# Patient Record
Sex: Male | Born: 1970 | Race: White | Hispanic: No | Marital: Married | Smoking: Current some day smoker
Health system: Southern US, Community
[De-identification: ages and names within clinical notes are randomized; demographics above are authoritative.]

## PROBLEM LIST (undated history)

## (undated) DIAGNOSIS — C801 Malignant (primary) neoplasm, unspecified: Secondary | ICD-10-CM

## (undated) HISTORY — PX: TUMOR REMOVAL: SHX12

## (undated) HISTORY — PX: TONSILLECTOMY: SUR1361

---

## 2017-07-06 ENCOUNTER — Emergency Department (HOSPITAL_COMMUNITY): Payer: Self-pay

## 2017-07-06 ENCOUNTER — Emergency Department (HOSPITAL_COMMUNITY)
Admission: EM | Admit: 2017-07-06 | Discharge: 2017-07-06 | Disposition: A | Discharge status: Home / Self Care | Payer: Self-pay | Attending: Emergency Medicine | Admitting: Emergency Medicine

## 2017-07-06 ENCOUNTER — Encounter (HOSPITAL_COMMUNITY): Payer: Self-pay | Admitting: Emergency Medicine

## 2017-07-06 ENCOUNTER — Other Ambulatory Visit: Payer: Self-pay

## 2017-07-06 DIAGNOSIS — Y999 Unspecified external cause status: Secondary | ICD-10-CM | POA: Insufficient documentation

## 2017-07-06 DIAGNOSIS — M67432 Ganglion, left wrist: Secondary | ICD-10-CM | POA: Insufficient documentation

## 2017-07-06 DIAGNOSIS — W010XXA Fall on same level from slipping, tripping and stumbling without subsequent striking against object, initial encounter: Secondary | ICD-10-CM | POA: Insufficient documentation

## 2017-07-06 DIAGNOSIS — Y929 Unspecified place or not applicable: Secondary | ICD-10-CM | POA: Insufficient documentation

## 2017-07-06 DIAGNOSIS — Y9301 Activity, walking, marching and hiking: Secondary | ICD-10-CM | POA: Insufficient documentation

## 2017-07-06 DIAGNOSIS — S63502A Unspecified sprain of left wrist, initial encounter: Secondary | ICD-10-CM | POA: Insufficient documentation

## 2017-07-06 HISTORY — DX: Malignant (primary) neoplasm, unspecified: C80.1

## 2017-07-06 MED ORDER — HYDROCODONE-ACETAMINOPHEN 5-325 MG PO TABS: 1.0000 | ORAL_TABLET | ORAL | 0 refills | Status: DC | PRN

## 2017-07-06 MED ORDER — IBUPROFEN 800 MG PO TABS: 800.0000 mg | ORAL_TABLET | Freq: Three times a day (TID) | ORAL | 0 refills | Status: DC

## 2017-07-06 MED ORDER — IBUPROFEN 800 MG PO TABS
800.0000 mg | ORAL_TABLET | Freq: Once | ORAL | Status: AC
  Administered 2017-07-06: 800 mg via ORAL
  Filled 2017-07-06: qty 1

## 2017-07-06 NOTE — ED Notes (Signed)
Pt to xray

## 2017-07-06 NOTE — ED Notes (Signed)
Pt left wrist has a large knot (deformity). Stated this happened 3 hours ago. Slight numbness to the back of wrist. States index finger and middle finger are tingling.

## 2017-07-06 NOTE — Discharge Instructions (Addendum)
Rest the wrist by using the ace wrap, ice and elevation will help with pain, swelling and frequently may also help reduce if not completely resolve the size of the suspected ganglion cyst.  Apply ice for 15 minutes every 2-3 hours for the next 2 days.  You may also start using a heating pad which will help speed healing, but do not use heat until the swelling and tingling is resolved.  Call Dr. Caralyn Guile for further evaluation of your wrist if your symptoms are not improving over the next 7-10 days.

## 2017-07-06 NOTE — ED Provider Notes (Signed)
[facility] EMERGENCY DEPARTMENT Provider Note   CSN: [id number] Arrival date & time: 07/06/17  1607     History   Chief Complaint Chief Complaint  Patient presents with  . Wrist Injury    HPI Brent Tran is a 47 y.o. right handed male presenting with left wrist pain and swelling after tripping, catching himself with the outstretched left hand.  He heard a popping sensation with the injury and has had persistent pain and swelling since the event.  He denies numbness in his hand or fingers.  Movement and palpation worsens his pain. He has had no medicine or treatment prior to arrival.  The injury occurred as he was stepping onto his roof from a ladder just prior to arrival.  The history is provided by the patient and the spouse.    Past Medical History:  Diagnosis Date  . Cancer ([place])     There are no active problems to display for this patient.   Past Surgical History:  Procedure Laterality Date  . TUMOR REMOVAL          Home Medications    Prior to Admission medications   Medication Sig Start Date End Date Taking? Authorizing Provider  HYDROcodone-acetaminophen (NORCO/VICODIN) 5-325 MG tablet Take 1 tablet by mouth every 4 (four) hours as needed. 07/06/17   Anjana Cheek, Almyra Free, PA-C  ibuprofen (ADVIL,MOTRIN) 800 MG tablet Take 1 tablet (800 mg total) by mouth 3 (three) times daily. 07/06/17   Evalee Jefferson, PA-C    Family History History reviewed. No pertinent family history.  Social History Social History   Tobacco Use  . Smoking status: Never Smoker  . Smokeless tobacco: Never Used  Substance Use Topics  . Alcohol use: Never    Frequency: Never  . Drug use: Never     Allergies   Penicillins and Ultram [tramadol hcl]   Review of Systems Review of Systems  Constitutional: Negative for fever.  Musculoskeletal: Positive for arthralgias and joint swelling. Negative for myalgias.  Neurological: Negative for weakness and numbness.     Physical  Exam Updated Vital Signs BP 117/75 (BP Location: Right Arm)   Pulse 85   Temp 99 F (37.2 C) (Temporal)   Resp 10   Ht 5\' 10"  (1.778 m)   Wt 81.6 kg (180 lb)   SpO2 97%   BMI 25.83 kg/m   Physical Exam  Constitutional: He appears well-developed and well-nourished.  HENT:  Head: Atraumatic.  Neck: Normal range of motion.  Cardiovascular:  Pulses:      Radial pulses are 2+ on the right side, and 2+ on the left side.  Pulses equal bilaterally  Musculoskeletal: He exhibits tenderness.       Left wrist: He exhibits decreased range of motion, tenderness, bony tenderness and swelling. He exhibits no crepitus and no deformity.  Localized, well circumscribed nodule left dorsal wrist, slightly tender, mobile.  Neurological: He is alert. He has normal strength. He displays normal reflexes. No sensory deficit.  Skin: Skin is warm and dry.  Psychiatric: He has a normal mood and affect.     ED Treatments / Results  Labs (all labs ordered are listed, but only abnormal results are displayed) Labs Reviewed - No data to display  EKG None  Radiology Dg Wrist Complete Left  Result Date: 07/06/2017 CLINICAL DATA:  Fall, LEFT wrist injury. Hematoma present on dorsal aspect. EXAM: LEFT WRIST - COMPLETE 3+ VIEW COMPARISON:  None. FINDINGS: Osseous alignment is normal. No fracture line or displaced  fracture fragment identified. Soft tissue prominence along the dorsum of the carpal bones, compatible with the given history of hematoma. IMPRESSION: No osseous fracture or dislocation. Electronically Signed   By: Franki Cabot M.D.   On: 07/06/2017 16:41    Procedures Procedures (including critical care time)  Medications Ordered in ED Medications  ibuprofen (ADVIL,MOTRIN) tablet 800 mg (800 mg Oral Given 07/06/17 1712)     Initial Impression / Assessment and Plan / ED Course  I have reviewed the triage vital signs and the nursing notes.  Pertinent labs & imaging results that were available  during my care of the patient were reviewed by me and considered in my medical decision making (see chart for details).     Pt with wrist sprain with exam suggesting traumatic ganglion cyst.  He was placed in an ace wrap, discussed RICE, joint rest.  Plan f/u with ortho if sx persist or do not improve as expected. Referral given.   Final Clinical Impressions(s) / ED Diagnoses   Final diagnoses:  Wrist sprain, left, initial encounter  Ganglion cyst of dorsum of left wrist    ED Discharge Orders        Ordered    ibuprofen (ADVIL,MOTRIN) 800 MG tablet  3 times daily     07/06/17 1712    HYDROcodone-acetaminophen (NORCO/VICODIN) 5-325 MG tablet  Every 4 hours PRN     07/06/17 1712       Evalee Jefferson, PA-C 07/07/17 0138    Daleen Bo, MD 07/07/17 [phone]

## 2017-07-06 NOTE — ED Triage Notes (Signed)
Pt fell and caught himself on his left wrist. Heard a "pop" swelling noted to left wrist.

## 2018-04-03 ENCOUNTER — Emergency Department (HOSPITAL_COMMUNITY)
Admission: EM | Admit: 2018-04-03 | Discharge: 2018-04-04 | Disposition: A | Discharge status: Home / Self Care | Payer: Self-pay | Attending: Emergency Medicine | Admitting: Emergency Medicine

## 2018-04-03 ENCOUNTER — Other Ambulatory Visit: Payer: Self-pay

## 2018-04-03 ENCOUNTER — Encounter (HOSPITAL_COMMUNITY): Payer: Self-pay | Admitting: Emergency Medicine

## 2018-04-03 DIAGNOSIS — F19150 Other psychoactive substance abuse with psychoactive substance-induced psychotic disorder with delusions: Secondary | ICD-10-CM | POA: Insufficient documentation

## 2018-04-03 DIAGNOSIS — F1910 Other psychoactive substance abuse, uncomplicated: Secondary | ICD-10-CM

## 2018-04-03 DIAGNOSIS — F22 Delusional disorders: Secondary | ICD-10-CM

## 2018-04-03 DIAGNOSIS — F17210 Nicotine dependence, cigarettes, uncomplicated: Secondary | ICD-10-CM | POA: Insufficient documentation

## 2018-04-03 DIAGNOSIS — R45851 Suicidal ideations: Secondary | ICD-10-CM | POA: Insufficient documentation

## 2018-04-03 LAB — RAPID URINE DRUG SCREEN, HOSP PERFORMED
Amphetamines: POSITIVE — AB
Barbiturates: NOT DETECTED
Benzodiazepines: NOT DETECTED
COCAINE: NOT DETECTED
OPIATES: NOT DETECTED
Tetrahydrocannabinol: NOT DETECTED

## 2018-04-03 LAB — COMPREHENSIVE METABOLIC PANEL
ALK PHOS: 55 U/L (ref 38–126)
ALT: 21 U/L (ref 0–44)
AST: 24 U/L (ref 15–41)
Albumin: 4.2 g/dL (ref 3.5–5.0)
Anion gap: 8 (ref 5–15)
BUN: 13 mg/dL (ref 6–20)
CO2: 26 mmol/L (ref 22–32)
CREATININE: 0.91 mg/dL (ref 0.61–1.24)
Calcium: 9.1 mg/dL (ref 8.9–10.3)
Chloride: 103 mmol/L (ref 98–111)
GFR calc non Af Amer: >60 mL/min (ref >60)
GLUCOSE: 98 mg/dL (ref 70–99)
Potassium: 3.4 mmol/L — ABNORMAL LOW (ref 3.5–5.1)
SODIUM: 137 mmol/L (ref 135–145)
Total Bilirubin: 0.6 mg/dL (ref 0.3–1.2)
Total Protein: 6.7 g/dL (ref 6.5–8.1)

## 2018-04-03 LAB — CBC WITH DIFFERENTIAL/PLATELET
ABS IMMATURE GRANULOCYTES: 0.02 10*3/uL (ref 0.00–0.07)
BASOS ABS: 0.1 10*3/uL (ref 0.0–0.1)
BASOS PCT: 1 %
EOS ABS: 0.5 10*3/uL (ref 0.0–0.5)
Eosinophils Relative: 5 %
HEMATOCRIT: 44.4 % (ref 39.0–52.0)
Hemoglobin: 14.7 g/dL (ref 13.0–17.0)
IMMATURE GRANULOCYTES: 0 %
LYMPHS ABS: 2.4 10*3/uL (ref 0.7–4.0)
Lymphocytes Relative: 25 %
MCH: 30.2 pg (ref 26.0–34.0)
MCHC: 33.1 g/dL (ref 30.0–36.0)
MCV: 91.2 fL (ref 80.0–100.0)
MONOS PCT: 6 %
Monocytes Absolute: 0.6 10*3/uL (ref 0.1–1.0)
NEUTROS ABS: 6.1 10*3/uL (ref 1.7–7.7)
NEUTROS PCT: 63 %
NRBC: 0 % (ref 0.0–0.2)
PLATELETS: 387 10*3/uL (ref 150–400)
RBC: 4.87 MIL/uL (ref 4.22–5.81)
RDW: 12.5 % (ref 11.5–15.5)
WBC: 9.6 10*3/uL (ref 4.0–10.5)

## 2018-04-03 LAB — ETHANOL: Alcohol, Ethyl (B): <10 mg/dL (ref <10)

## 2018-04-03 MED ORDER — POTASSIUM CHLORIDE CRYS ER 20 MEQ PO TBCR
40.0000 meq | EXTENDED_RELEASE_TABLET | Freq: Once | ORAL | Status: AC
  Administered 2018-04-03: 40 meq via ORAL
  Filled 2018-04-03: qty 2

## 2018-04-03 NOTE — Progress Notes (Signed)
CSW received phone call from pt's wife, Thaddaeus Granja [phone]). She reports that she petitioned for pt's IVC earlier today after he had attempted to jump out of the car as she was taking him to an outpatient [place] department. Mrs. Rison reports that pt is using "ICE" and has been on a "downward spiral" for the last three weeks. He has become increasingly paranoid and delusional; to the point that she reports she has been locking her bedroom door at night out of fear of his erratic behavior (accusing her of cheating, drilling holes in her bedroom wall to watch to see if she's with another man, feeling that people are looking in his windows). She is unsure about the frequency and route of his substance use. CSW will notified counselor and [facility] NP on collateral information provided.   Audree Camel, LCSW, [place] Disposition [facility]/TTS [phone]

## 2018-04-03 NOTE — ED Notes (Signed)
Sitter to desk stating pt requests tablet from his personal belongings, this RN advised pt not allowed to have personal belongings, pt advised, pt calm  

## 2018-04-03 NOTE — ED Notes (Signed)
Telepsych set up in room
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
## 2018-04-03 NOTE — ED Triage Notes (Signed)
Papers taken out by patient's wife for commitment due to drug use. Patient calm and cooperative in Triage.

## 2018-04-03 NOTE — ED Provider Notes (Signed)
[facility] EMERGENCY DEPARTMENT Provider Note   CSN: [id number] Arrival date & time: 04/03/18  1516    History   Chief Complaint Chief Complaint  Patient presents with   V70.1    HPI Brent Tran is a 48 y.o. male.     HPI  Pt was seen at 1540. Per Police, IVC paperwork and pt report: Pt brought to ED by Police under IVC taken out by pt's wife for "been on meth or ice and he has been seeing things and hearing voices." "He has been talking about killing himself. He has been peeking into windows and around doors. He was seen by someone from [facility] on last night and referred to mental health today but refuses to for a followup. He attempted to  Jump from the car as his wife was driving him there." Pt himself denies SI, but states he "knows his wife has been talking to someone else on the phone because she's acting secretive." Denies HI.   Past Medical History:  Diagnosis Date   Cancer ([place])     There are no active problems to display for this patient.   Past Surgical History:  Procedure Laterality Date   TONSILLECTOMY     TUMOR REMOVAL          Home Medications    Prior to Admission medications   Medication Sig Start Date End Date Taking? Authorizing Provider  HYDROcodone-acetaminophen (NORCO/VICODIN) 5-325 MG tablet Take 1 tablet by mouth every 4 (four) hours as needed. 07/06/17   Idol, Almyra Free, PA-C  ibuprofen (ADVIL,MOTRIN) 800 MG tablet Take 1 tablet (800 mg total) by mouth 3 (three) times daily. 07/06/17   Evalee Jefferson, PA-C    Family History Family History  Problem Relation Age of Onset   Heart disease Father    Cancer Father     Social History Social History   Tobacco Use   Smoking status: Current Every Day Smoker    Types: Cigarettes   Smokeless tobacco: Never Used  Substance Use Topics   Alcohol use: Never    Frequency: Never   Drug use: Yes    Comment: ice, 2 times daily     Allergies   Penicillins and Ultram [tramadol  hcl]   Review of Systems Review of Systems ROS: Statement: All systems negative except as marked or noted in the HPI; Constitutional: Negative for fever and chills. ; ; Eyes: Negative for eye pain, redness and discharge. ; ; ENMT: Negative for ear pain, hoarseness, nasal congestion, sinus pressure and sore throat. ; ; Cardiovascular: Negative for chest pain, palpitations, diaphoresis, dyspnea and peripheral edema. ; ; Respiratory: Negative for cough, wheezing and stridor. ; ; Gastrointestinal: Negative for nausea, vomiting, diarrhea, abdominal pain, blood in stool, hematemesis, jaundice and rectal bleeding. . ; ; Genitourinary: Negative for dysuria, flank pain and hematuria. ; ; Musculoskeletal: Negative for back pain and neck pain. Negative for swelling and trauma.; ; Skin: Negative for pruritus, rash, abrasions, blisters, bruising and skin lesion.; ; Neuro: Negative for headache, lightheadedness and neck stiffness. Negative for weakness, altered level of consciousness, altered mental status, extremity weakness, paresthesias, involuntary movement, seizure and syncope.; Psych:  +SI, hallucinations per IVC paperwork. No HI.       Physical Exam Updated Vital Signs BP (!) 132/92 (BP Location: Right Arm)    Pulse 82    Temp 98.2 F (36.8 C) (Oral)    Resp 15    Ht 5\' 9"  (1.753 m)    SpO2 100%  BMI 26.58 kg/m   Physical Exam 1545: Physical examination:  Nursing notes reviewed; Vital signs and O2 SAT reviewed;  Constitutional: Well developed, Well nourished, Well hydrated, In no acute distress; Head:  Normocephalic, atraumatic; Eyes: EOMI, PERRL, No scleral icterus; ENMT: Mouth and pharynx normal, Mucous membranes moist; Neck: Supple, Full range of motion; Cardiovascular: Regular rate and rhythm; Respiratory: Breath sounds clear, No wheezes.  Speaking full sentences with ease, Normal respiratory effort/excursion; Chest: No deformity, Movement normal; Abdomen: Nondistended; Extremities: No deformity.;  Neuro: AA&Ox3, Major CN grossly intact.  Speech clear. No gross focal motor deficits in extremities. Climbs on and off stretcher easily by himself. Gait steady.; Skin: Color normal, Warm, Dry.; Psych:  Affect flat.    ED Treatments / Results  Labs (all labs ordered are listed, but only abnormal results are displayed)   EKG EKG Interpretation  Date/Time:  Monday April 03 2018 16:00:02 EDT Ventricular Rate:  77 PR Interval:    QRS Duration: 77 QT Interval:  382 QTC Calculation: 433 R Axis:   54 Text Interpretation:  Sinus rhythm Right atrial enlargement Anterior infarct, old No old tracing to compare Confirmed by Francine Graven [phone]) on 04/03/2018 4:03:51 PM   Radiology   Procedures Procedures (including critical care time)  Medications Ordered in ED Medications - No data to display   Initial Impression / Assessment and Plan / ED Course  I have reviewed the triage vital signs and the nursing notes.  Pertinent labs & imaging results that were available during my care of the patient were reviewed by me and considered in my medical decision making (see chart for details).     MDM Reviewed: previous chart, nursing note and vitals Reviewed previous: labs Interpretation: labs and ECG    Results for orders placed or performed during the hospital encounter of 04/03/18  Comprehensive metabolic panel  Result Value Ref Range   Sodium 137 135 - 145 mmol/L   Potassium 3.4 (L) 3.5 - 5.1 mmol/L   Chloride 103 98 - 111 mmol/L   CO2 26 22 - 32 mmol/L   Glucose, Bld 98 70 - 99 mg/dL   BUN 13 6 - 20 mg/dL   Creatinine, Ser 0.91 0.61 - 1.24 mg/dL   Calcium 9.1 8.9 - 10.3 mg/dL   Total Protein 6.7 6.5 - 8.1 g/dL   Albumin 4.2 3.5 - 5.0 g/dL   AST 24 15 - 41 U/L   ALT 21 0 - 44 U/L   Alkaline Phosphatase 55 38 - 126 U/L   Total Bilirubin 0.6 0.3 - 1.2 mg/dL   GFR calc non Af Amer >60 >60 mL/min   GFR calc Af Amer >60 >60 mL/min   Anion gap 8 5 - 15  Ethanol  Result Value  Ref Range   Alcohol, Ethyl (B) <10 <10 mg/dL  Urine rapid drug screen (hosp performed)  Result Value Ref Range   Opiates NONE DETECTED NONE DETECTED   Cocaine NONE DETECTED NONE DETECTED   Benzodiazepines NONE DETECTED NONE DETECTED   Amphetamines POSITIVE (A) NONE DETECTED   Tetrahydrocannabinol NONE DETECTED NONE DETECTED   Barbiturates NONE DETECTED NONE DETECTED  CBC with Diff  Result Value Ref Range   WBC 9.6 4.0 - 10.5 K/uL   RBC 4.87 4.22 - 5.81 MIL/uL   Hemoglobin 14.7 13.0 - 17.0 g/dL   HCT 44.4 39.0 - 52.0 %   MCV 91.2 80.0 - 100.0 fL   MCH 30.2 26.0 - 34.0 pg   MCHC 33.1 30.0 -  36.0 g/dL   RDW 12.5 11.5 - 15.5 %   Platelets 387 150 - 400 K/uL   nRBC 0.0 0.0 - 0.2 %   Neutrophils Relative % 63 %   Neutro Abs 6.1 1.7 - 7.7 K/uL   Lymphocytes Relative 25 %   Lymphs Abs 2.4 0.7 - 4.0 K/uL   Monocytes Relative 6 %   Monocytes Absolute 0.6 0.1 - 1.0 K/uL   Eosinophils Relative 5 %   Eosinophils Absolute 0.5 0.0 - 0.5 K/uL   Basophils Relative 1 %   Basophils Absolute 0.1 0.0 - 0.1 K/uL   Immature Granulocytes 0 %   Abs Immature Granulocytes 0.02 0.00 - 0.07 K/uL    1630:  Potassium repleted PO. Will have TTS evaluate.   1835:  TTS has evaluated pt: pt to stay in ED overnight for re-evaluation tomorrow morning. Holding orders written.     Final Clinical Impressions(s) / ED Diagnoses   Final diagnoses:  None    ED Discharge Orders    None       Francine Graven, DO 04/03/18 2055

## 2018-04-04 ENCOUNTER — Encounter (HOSPITAL_COMMUNITY): Payer: Self-pay | Admitting: Registered Nurse

## 2018-04-04 NOTE — ED Notes (Signed)
Patient given discharge instruction, verbalized understand. Patient ambulatory out of the department.  

## 2018-04-04 NOTE — ED Notes (Signed)
Belonging returned to patient and wife

## 2018-04-04 NOTE — ED Notes (Signed)
Rescinded IVC Papers completed, and faxed to [place].

## 2018-04-04 NOTE — Consult Note (Addendum)
Tele Assessment   Brent Tran, 48 y.o., male patient presented to [facility] under IVC by his wife with complaints of drug use (ICE), paranoia and delusional thinking.  Patient seen via telepsych by this provider; chart reviewed and consulted with Dr. Dwyane Dee on 04/04/18.  On evaluation Brent Tran reports he was brought to the hospital because his wife had him committed so that he can get help.  Patient states that his wife said he was people on her; but patient reports " I think she went into relationship but she is afraid that I am going to do something to her something.  I didn't drill no holes in the wall.  I have my camera my phone and I was recording her because I thought she was in bed room talking to somebody on the phone.  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  Patient denies any auditory/visual hallucination or delusional thinking.  Patient also denies suicidal/self-harm/homicidal ideation, psychosis, and paranoia.  Patient denies psychiatric past history.  Reports he is never been inpatient or outpatient and has never been on any psych psychotropics.  Patient states he has been to rehab once about 10 years ago and need a 78-month program and was clean for 8 months when he finished; but he is not interested in any rehab at this time.  Patient does report that he has anxiety and feels that he does the methamphetamine to assist with his anxiety and is interested in being started on a medication to help with his anxiety.  Patient states that he is willing to go to [facility] for outpatient services. "That's where I was supposed to be going yesterday but she took me to the [facility] and have me committed."  Patient also denies a prior history of violence and states that he has no current charges nor is he on probation.  Patient states there are no weapons in his home.  Patient states he lives in the home with  his wife and no one else; but he does have 2 children that are grown.  During evaluation Corbett Moulder is sitting up in chair; he is alert/oriented x 4; calm/cooperative; and mood congruent with affect.  Patient is speaking in a clear tone at moderate volume, and normal pace; with good eye contact.  His thought process is coherent and relevant; There is no indication that he is currently responding to internal/external stimuli or experiencing delusional thought content.Possible that paranoia occurs immediately after drug use and then clears up.  Patient denies suicidal/self-harm/homicidal ideation, psychosis, and paranoia.  Patient has remained calm throughout assessment and has answered questions appropriately.   Spoke with the nurse of patient Aaron Edelman. RN who informed he has not seen any abnormal behavior of patient,  Patient has been calm and no problems; and patient hasn't appeared or acted paranoid or hallucinating.     Recommendations:  Outpatient psychiatric service.  Patient to follow up at [facility].    Disposition: Patient psychiatrically cleared No evidence of imminent risk to self or others at present.   Patient does not meet criteria for psychiatric inpatient admission. Supportive therapy provided about ongoing stressors. Discussed crisis plan, support from social network, calling 911, coming to the Emergency Department, and calling Suicide Hotline. Follow-up at [facility] for outpatient services   Spoke with Aaron Edelman, RN; informed of above recommendation and disposition; informed he  would inform EDP   Earleen Newport, NP

## 2018-04-04 NOTE — Clinical Social Work Note (Signed)
Met with patient, who denied that substance use has gotten away from him.  "My wife was calling me delusional and accusing me of things that were not true, and when I told her I was leaving her and going to [place], she called someone and I ended up here."  Later admitted that he has missed some days of work because he was using. 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 Patient was OK with me calling his wife, who confirmed that she would come pick him up.  She had no further questions for me, but did ask to speak to patient. I offered the patient a referral to services as [facility], but he declined.  He did, however, agree to follow up at [facility] "because I self medicate because of anxiety.  I got him an appointment for tomorrow. 

## 2018-04-04 NOTE — ED Notes (Signed)
Social worker at the bedside, waiting on wife to bring belonging and for paper work to be resented.

## 2018-04-04 NOTE — ED Notes (Signed)
Wife her to pick up, confirmed he has an appointment at [facility] tomorrow at [facility]

## 2018-04-04 NOTE — ED Provider Notes (Signed)
Pt brought in yesterday for psych eval.  He has been using meth and has been paranoid.  The pt denies si or hi.  He denies any hallucinations.  He was seen again this morning by Earleen Newport, NP for TTS and she with Dr. Dwyane Dee cleared him for d/c.  He was not interested in rehab.  I agree that he does not meet inpatient criteria.  Pt is stable for d/c home.  He is given a Warden/ranger for detox programs if he changes his mind.   Isla Pence, MD 04/04/18 1050

## 2019-03-22 ENCOUNTER — Emergency Department (HOSPITAL_COMMUNITY)
Admission: EM | Admit: 2019-03-22 | Discharge: 2019-03-22 | Disposition: A | Discharge status: Home / Self Care | Payer: Self-pay | Attending: Emergency Medicine | Admitting: Emergency Medicine

## 2019-03-22 ENCOUNTER — Emergency Department (HOSPITAL_COMMUNITY)
Admission: EM | Admit: 2019-03-22 | Discharge: 2019-03-22 | Disposition: A | Discharge status: Home / Self Care | Payer: Self-pay

## 2019-03-22 ENCOUNTER — Other Ambulatory Visit: Payer: Self-pay

## 2019-03-22 ENCOUNTER — Encounter (HOSPITAL_COMMUNITY): Payer: Self-pay | Admitting: Student

## 2019-03-22 ENCOUNTER — Encounter (HOSPITAL_COMMUNITY): Payer: Self-pay | Admitting: Emergency Medicine

## 2019-03-22 DIAGNOSIS — F17210 Nicotine dependence, cigarettes, uncomplicated: Secondary | ICD-10-CM | POA: Insufficient documentation

## 2019-03-22 DIAGNOSIS — R441 Visual hallucinations: Secondary | ICD-10-CM | POA: Insufficient documentation

## 2019-03-22 DIAGNOSIS — R440 Auditory hallucinations: Secondary | ICD-10-CM

## 2019-03-22 LAB — COMPREHENSIVE METABOLIC PANEL
ALT: 18 U/L (ref 0–44)
AST: 21 U/L (ref 15–41)
Albumin: 4.2 g/dL (ref 3.5–5.0)
Alkaline Phosphatase: 51 U/L (ref 38–126)
Anion gap: 7 (ref 5–15)
BUN: 10 mg/dL (ref 6–20)
CO2: 29 mmol/L (ref 22–32)
Calcium: 9.4 mg/dL (ref 8.9–10.3)
Chloride: 101 mmol/L (ref 98–111)
Creatinine, Ser: 0.77 mg/dL (ref 0.61–1.24)
GFR calc Af Amer: >60 mL/min (ref >60)
GFR calc non Af Amer: >60 mL/min (ref >60)
Glucose, Bld: 89 mg/dL (ref 70–99)
Potassium: 3.9 mmol/L (ref 3.5–5.1)
Sodium: 137 mmol/L (ref 135–145)
Total Bilirubin: 0.5 mg/dL (ref 0.3–1.2)
Total Protein: 7 g/dL (ref 6.5–8.1)

## 2019-03-22 LAB — CBC
HCT: 45.5 % (ref 39.0–52.0)
Hemoglobin: 15 g/dL (ref 13.0–17.0)
MCH: 29.8 pg (ref 26.0–34.0)
MCHC: 33 g/dL (ref 30.0–36.0)
MCV: 90.5 fL (ref 80.0–100.0)
Platelets: 382 10*3/uL (ref 150–400)
RBC: 5.03 MIL/uL (ref 4.22–5.81)
RDW: 12.5 % (ref 11.5–15.5)
WBC: 9.6 10*3/uL (ref 4.0–10.5)
nRBC: 0 % (ref 0.0–0.2)

## 2019-03-22 LAB — ACETAMINOPHEN LEVEL: Acetaminophen (Tylenol), Serum: <10 ug/mL — ABNORMAL LOW (ref 10–30)

## 2019-03-22 LAB — SALICYLATE LEVEL: Salicylate Lvl: <7 mg/dL — ABNORMAL LOW (ref 7.0–30.0)

## 2019-03-22 LAB — ETHANOL: Alcohol, Ethyl (B): <10 mg/dL (ref <10)

## 2019-03-22 NOTE — Discharge Instructions (Signed)
Please follow-up with outpatient psychiatric resources within 48 hours.  Return to the emergency department for new or worsening symptoms or any other concerns.

## 2019-03-22 NOTE — ED Triage Notes (Signed)
Pt returns after just eloping. Said he might as well stay. Pt has auditory hallucinations x3 days after meth. Denies si hi or hearing them currently.

## 2019-03-22 NOTE — ED Notes (Signed)
Pt left refuses d/c papers. Saying he wouldn't follow up anyway.

## 2019-03-22 NOTE — ED Triage Notes (Signed)
Patient complains of auditory hallucinations that began 3 days ago when he last used Methamphetamines. Patient denies SI/HI. Patient is here for medical clearance with hopes of utilizing outpatient services at [facility].

## 2019-03-22 NOTE — ED Notes (Signed)
Pt left stating he didn't want to stay. Denies SI HI said that he will wait for a ride.

## 2019-03-22 NOTE — ED Notes (Signed)
Pt wanting to leave again. Pt sitting on the bed calling for ride said that he can wait until the ride gets here

## 2019-03-22 NOTE — ED Provider Notes (Signed)
[facility] EMERGENCY DEPARTMENT Provider Note   CSN: [id number] Arrival date & time: 03/22/19  2007     History Chief Complaint  Patient presents with  . Hallucinations    Brent Tran is a 49 y.o. male with a history of tobacco abuse and prior bladder cancer status post tumor resection who presents to the emergency department with complaints of auditory hallucinations which have been occurring over the past 6 months, seem to worsen over the past few weeks.  Patient states that he is hearing voices, he used to hear his wife's voice, now no specific individual.  He does not further elaborate on what the voices are telling him.  These are auditory hallucinations occur intermittently, no specific alleviating or aggravating factors, not currently present.  Denies suicidal ideations or homicidal ideations.  He states that he has been using meth daily for the past few years, he stopped using a few days ago, he would like to quit.  He denies drug use use.  He does drink occasionally, not daily.  Denies fever, chills, cough, chest pain, shortness of breath, abdominal pain, or vomiting.  HPI     Past Medical History:  Diagnosis Date  . Cancer [facility])    bladder    There are no problems to display for this patient.   Past Surgical History:  Procedure Laterality Date  . TONSILLECTOMY    . TUMOR REMOVAL         Family History  Problem Relation Age of Onset  . Heart disease Father   . Cancer Father     Social History   Tobacco Use  . Smoking status: Current Every Day Smoker    Types: Cigarettes  . Smokeless tobacco: Never Used  Substance Use Topics  . Alcohol use: Never  . Drug use: Yes    Comment: ice, 2 times daily    Home Medications Prior to Admission medications   Medication Sig Start Date End Date Taking? Authorizing Provider  naproxen sodium (ALEVE) 220 MG tablet Take 220 mg by mouth 2 (two) times daily as needed (pain).    [provider]    Allergies     Ultram [tramadol hcl] and Penicillins  Review of Systems   Review of Systems  Constitutional: Negative for chills and fever.  Respiratory: Negative for cough and shortness of breath.   Cardiovascular: Negative for chest pain.  Gastrointestinal: Negative for abdominal pain and vomiting.  Neurological: Negative for headaches.  Psychiatric/Behavioral: Positive for hallucinations. Negative for suicidal ideas.  All other systems reviewed and are negative.   Physical Exam Updated Vital Signs BP 123/66 (BP Location: Left Arm)   Pulse 78   Temp 98.2 F (36.8 C)   Resp 18   SpO2 100%   Physical Exam Vitals and nursing note reviewed.  Constitutional:      General: He is not in acute distress.    Appearance: He is well-developed. He is not toxic-appearing.  HENT:     Head: Normocephalic and atraumatic.  Eyes:     General:        Right eye: No discharge.        Left eye: No discharge.     Conjunctiva/sclera: Conjunctivae normal.  Cardiovascular:     Rate and Rhythm: Normal rate and regular rhythm.  Pulmonary:     Effort: Pulmonary effort is normal. No respiratory distress.     Breath sounds: Normal breath sounds. No wheezing, rhonchi or rales.  Abdominal:     General:  There is no distension.     Palpations: Abdomen is soft.     Tenderness: There is no abdominal tenderness.  Musculoskeletal:     Cervical back: Neck supple.  Skin:    General: Skin is warm and dry.     Findings: No rash.  Neurological:     Mental Status: He is alert.     Comments: Clear speech.   Psychiatric:        Attention and Perception: He perceives auditory hallucinations. He does not perceive visual hallucinations.        Behavior: Behavior normal.        Thought Content: Thought content does not include homicidal or suicidal ideation. Thought content does not include homicidal or suicidal plan.     Comments: Patient does not appear to be actively responding to internal stimuli.     ED Results /  Procedures / Treatments   Labs (all labs ordered are listed, but only abnormal results are displayed) Labs Reviewed  COMPREHENSIVE METABOLIC PANEL  ETHANOL  SALICYLATE LEVEL  ACETAMINOPHEN LEVEL  CBC  RAPID URINE DRUG SCREEN, HOSP PERFORMED    EKG None  Radiology No results found.  Procedures Procedures (including critical care time)  Medications Ordered in ED Medications - No data to display  ED Course  I have reviewed the triage vital signs and the nursing notes.  Pertinent labs & imaging results that were available during my care of the patient were reviewed by me and considered in my medical decision making (see chart for details).    MDM Rules/Calculators/A&P                      Patient presents to the emergency department with complaints of auditory hallucinations and wanting to discontinue methamphetamine use.  Patient nontoxic, resting comfortably, vitals WNL.  Benign physical exam.  Denies current hallucinations and does not appear to be actively responding to internal stimuli.  Patient denies suicidal or homicidal ideations.  Will obtain screening labs and consult TTS.  CBC/CMP- unremarkable.  Remaining labs pending.   22:06: Patient informed staff he would like to leave the emergency department, he is alert & oriented x 3, he has denied suicidal/homicidal ideations on multiple assessment in the ED by staff, does not appear to be actively responding to internal stimuli, does not require IVC. Will provide resources. I discussed results, treatment plan, need for follow-up, and return precautions with the patient. Provided opportunity for questions, patient confirmed understanding and is in agreement with plan.   Findings and plan of care discussed with supervising physician Dr. Langston Masker who is in agreement.   Final Clinical Impression(s) / ED Diagnoses Final diagnoses:  Auditory hallucinations    Rx / DC Orders ED Discharge Orders    None       Leafy Kindle 03/22/19 2211    Wyvonnia Dusky, MD 03/23/19 [phone]

## 2019-12-01 ENCOUNTER — Emergency Department (HOSPITAL_COMMUNITY): Payer: Self-pay

## 2019-12-01 ENCOUNTER — Other Ambulatory Visit: Payer: Self-pay

## 2019-12-01 ENCOUNTER — Emergency Department (HOSPITAL_COMMUNITY)
Admission: EM | Admit: 2019-12-01 | Discharge: 2019-12-01 | Disposition: A | Discharge status: Home / Self Care | Payer: Self-pay | Attending: Emergency Medicine | Admitting: Emergency Medicine

## 2019-12-01 ENCOUNTER — Encounter (HOSPITAL_COMMUNITY): Payer: Self-pay | Admitting: *Deleted

## 2019-12-01 DIAGNOSIS — J0100 Acute maxillary sinusitis, unspecified: Secondary | ICD-10-CM

## 2019-12-01 DIAGNOSIS — S20212A Contusion of left front wall of thorax, initial encounter: Secondary | ICD-10-CM

## 2019-12-01 DIAGNOSIS — R079 Chest pain, unspecified: Secondary | ICD-10-CM | POA: Insufficient documentation

## 2019-12-01 DIAGNOSIS — R059 Cough, unspecified: Secondary | ICD-10-CM | POA: Insufficient documentation

## 2019-12-01 DIAGNOSIS — Z8551 Personal history of malignant neoplasm of bladder: Secondary | ICD-10-CM | POA: Insufficient documentation

## 2019-12-01 DIAGNOSIS — Y93H2 Activity, gardening and landscaping: Secondary | ICD-10-CM | POA: Insufficient documentation

## 2019-12-01 DIAGNOSIS — S2242XB Multiple fractures of ribs, left side, initial encounter for open fracture: Secondary | ICD-10-CM

## 2019-12-01 DIAGNOSIS — F17210 Nicotine dependence, cigarettes, uncomplicated: Secondary | ICD-10-CM | POA: Insufficient documentation

## 2019-12-01 DIAGNOSIS — W11XXXA Fall on and from ladder, initial encounter: Secondary | ICD-10-CM | POA: Insufficient documentation

## 2019-12-01 MED ORDER — AZITHROMYCIN 250 MG PO TABS
500.0000 mg | ORAL_TABLET | Freq: Once | ORAL | Status: AC
  Administered 2019-12-01: 500 mg via ORAL
  Filled 2019-12-01: qty 2

## 2019-12-01 MED ORDER — AZITHROMYCIN 250 MG PO TABS: 250.0000 mg | ORAL_TABLET | Freq: Every day | ORAL | 0 refills | Status: AC

## 2019-12-01 MED ORDER — HYDROCODONE-ACETAMINOPHEN 5-325 MG PO TABS: 1.0000 | ORAL_TABLET | Freq: Four times a day (QID) | ORAL | 0 refills | Status: DC | PRN

## 2019-12-01 MED ORDER — HYDROCODONE-ACETAMINOPHEN 5-325 MG PO TABS
2.0000 | ORAL_TABLET | Freq: Once | ORAL | Status: AC
  Administered 2019-12-01: 2 via ORAL
  Filled 2019-12-01: qty 2

## 2019-12-01 NOTE — Discharge Instructions (Addendum)
It was our pleasure to provide your ER care today - we hope that you feel better.  Take full and deep breaths, stay active. Use incentive spirometer - 10 full/complete breaths in every hour while awake.   Take motrin or aleve as need for pain. You may also take hydrocodone as need for pain. No driving for the next 6 hours or when taking hydrocodone. Also, do not take tylenol or acetaminophen containing medication when taking hydrocodone.  For sinus, take antibiotic as prescribed. Try zyrtec-d or claritin-d (available over the counter) as need.   Follow up with your doctor in 1-2 weeks if symptoms fail to improve/resolve.  Return to ER if worse, new symptoms, worsening or severe pain, fevers, trouble breathing, or other concern.

## 2019-12-01 NOTE — ED Provider Notes (Addendum)
[facility] EMERGENCY DEPARTMENT Provider Note   CSN: [id number] Arrival date & time: 12/01/19  1335     History Chief Complaint  Patient presents with  . Chest Injury    Brent Tran is a 49 y.o. male.  Patient c/o fall from ladder 3 days ago while pruning tree. States was about 7-8 feet up, when a limb he cut fell against bladder causing him to fall to ground. No loc or head injury. Ambulatory since. C/o left mid to lateral chest pain since fall - symptoms acute onset, mod-severe, constant, persistent, worse w palpation and movement. No acute or abrupt worsening of pain today, but rather persistent pain. Denies other pain/injury. No neck/back pain. No extremity pain or injury. Skin intact. Also states feels he has sinus infection, notes hx same. States symptoms presents for past month, nasal drainage, sinus pressure. Denies any severe headaches. No neck pain or stiffness. No eye pain or photophobia. No fever/chills. Occasional non prod cough. +smoker. No hx asthma. No known specific ill contacts.   The history is provided by the patient.       Past Medical History:  Diagnosis Date  . 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   Past Surgical History:  Procedure Laterality Date  . TONSILLECTOMY    . TUMOR REMOVAL         Family History  Problem Relation Age of Onset  . Heart disease Father   . Cancer Father     Social History   Tobacco Use  . Smoking status: Current Every Day Smoker    Types: Cigarettes  . Smokeless tobacco: Never Used  Vaping Use  . Vaping Use: Some days  . Substances: Nicotine, Flavoring  Substance Use Topics  . Alcohol use: Never  . Drug use: Yes    Comment: ice, 2 times daily    Home Medications Prior to Admission medications   Medication Sig Start Date End Date Taking? Authorizing Provider  naproxen sodium (ALEVE) 220 MG tablet Take 220 mg by mouth 2 (two) times daily as needed (pain).     [provider]    Allergies    Ultram [tramadol hcl] and Penicillins  Review of Systems   Review of Systems  Constitutional: Negative for fever.  HENT: Positive for congestion, rhinorrhea and sinus pressure. Negative for sore throat and trouble swallowing.   Eyes: Negative for photophobia, pain, redness and visual disturbance.  Respiratory: Negative for shortness of breath.   Cardiovascular:       Recent chest contusion and related pain - no other recent chest pain or discomfort. No exertional cp.   Gastrointestinal: Negative for abdominal pain, nausea and vomiting.  Genitourinary: Negative for flank pain.  Musculoskeletal: Negative for back pain, neck pain and neck stiffness.  Skin: Negative for wound.  Neurological: Negative for syncope, weakness, numbness and headaches.  Hematological: Does not bruise/bleed easily.       No anticoag use.   Psychiatric/Behavioral: Negative for confusion.    Physical Exam Updated Vital Signs BP (!) 135/95 (BP Location: Right Arm)   Pulse (!) 101   Temp 97.8 F (36.6 C) (Oral)   Resp 20   Ht 1.778 m (5\' 10" )   Wt 77.1 kg   SpO2 99%   BMI 24.39 kg/m   Physical Exam Vitals and nursing note reviewed.  Constitutional:      Appearance: Normal appearance. He is well-developed.  HENT:  Head: Atraumatic.     Right Ear: Tympanic membrane normal.     Left Ear: Tympanic membrane normal.     Nose: Congestion present.     Comments: +sinus tenderness.     Mouth/Throat:     Mouth: Mucous membranes are moist.     Pharynx: Oropharynx is clear.  Eyes:     General: No scleral icterus.    Conjunctiva/sclera: Conjunctivae normal.     Pupils: Pupils are equal, round, and reactive to light.  Neck:     Trachea: No tracheal deviation.     Comments: No stiffness or rigidity. Normal rom.  Cardiovascular:     Rate and Rhythm: Normal rate and regular rhythm.     Pulses: Normal pulses.     Heart sounds: Normal heart sounds. No murmur heard.   No friction rub. No gallop.   Pulmonary:     Effort: Pulmonary effort is normal. No accessory muscle usage or respiratory distress.     Breath sounds: Normal breath sounds.     Comments: Left chest wall tenderness reproducing symptoms. No crepitus. Normal chest movement.  Abdominal:     General: Bowel sounds are normal. There is no distension.     Palpations: Abdomen is soft.     Tenderness: There is no abdominal tenderness. There is no guarding.     Comments: No abd contusion or bruising.   Genitourinary:    Comments: No cva tenderness. Musculoskeletal:        General: No swelling.     Cervical back: Normal range of motion and neck supple. No rigidity or tenderness.     Comments: CTLS spine, non tender, aligned, no step off. No extremity pain/tenderness.   Skin:    General: Skin is warm and dry.     Findings: No rash.  Neurological:     Mental Status: He is alert.     Comments: Alert, speech clear. Steady gait.   Psychiatric:        Mood and Affect: Mood normal.     ED Results / Procedures / Treatments   Labs (all labs ordered are listed, but only abnormal results are displayed) Labs Reviewed - No data to display  EKG None  Radiology DG Ribs Unilateral W/Chest Left  Result Date: 12/01/2019 CLINICAL DATA:  [place] off a ladder 3 days ago.  LEFT-sided chest pain EXAM: LEFT RIBS AND CHEST - 3+ VIEW COMPARISON:  None. FINDINGS: Mild cortical irregularity of the LEFT anterolateral fifth and sixth ribs may reflect nondisplaced rib fractures. No displaced rib fractures are seen. No pneumothorax, focal consolidation or pleural effusion. Normal cardiomediastinal silhouette. IMPRESSION: Possible nondisplaced LEFT anterolateral fifth and sixth rib fractures. No pneumothorax. Correlate with point tenderness Electronically Signed   By: Valentino Saxon MD   On: 12/01/2019 14:22    Procedures Procedures (including critical care time)  Medications Ordered in ED Medications    HYDROcodone-acetaminophen (NORCO/VICODIN) 5-325 MG per tablet 2 tablet (has no administration in time range)  azithromycin (ZITHROMAX) tablet 500 mg (has no administration in time range)    ED Course  I have reviewed the triage vital signs and the nursing notes.  Pertinent labs & imaging results that were available during my care of the patient were reviewed by me and considered in my medical decision making (see chart for details).    MDM Rules/Calculators/A&P                         Xrays  ordered.  Reviewed nursing notes and prior charts for additional history.   xrays reviewed/interpreted by me - +left rib fx x 2.   Pt indicates has ride, did not drive. No meds pta.   Hydrocodone po.   zithromax po.  Incentive spirometer for home.   Pt breathing comfortably, currently hr 88, rr 14, breathing comfortably.   Return precautions provided.    Final Clinical Impression(s) / ED Diagnoses Final diagnoses:  None    Rx / DC Orders ED Discharge Orders    None           Lajean Saver, MD 12/01/19 1605

## 2019-12-01 NOTE — ED Notes (Signed)
Fall from ladder  Pain to L rib area   EDP has ev'd   Call to resp who will instruct re in. spiro

## 2019-12-01 NOTE — ED Triage Notes (Signed)
Pt fell off a ladder 3 days ago and pain to left ribs since.  Pt has pain with coughing.

## 2019-12-16 ENCOUNTER — Emergency Department (HOSPITAL_COMMUNITY)
Admission: EM | Admit: 2019-12-16 | Discharge: 2019-12-16 | Disposition: A | Discharge status: Home / Self Care | Payer: Self-pay | Attending: Emergency Medicine | Admitting: Emergency Medicine

## 2019-12-16 ENCOUNTER — Other Ambulatory Visit: Payer: Self-pay

## 2019-12-16 ENCOUNTER — Encounter (HOSPITAL_COMMUNITY): Payer: Self-pay

## 2019-12-16 DIAGNOSIS — F17210 Nicotine dependence, cigarettes, uncomplicated: Secondary | ICD-10-CM | POA: Insufficient documentation

## 2019-12-16 DIAGNOSIS — Z8551 Personal history of malignant neoplasm of bladder: Secondary | ICD-10-CM | POA: Insufficient documentation

## 2019-12-16 DIAGNOSIS — R443 Hallucinations, unspecified: Secondary | ICD-10-CM | POA: Insufficient documentation

## 2019-12-16 DIAGNOSIS — F430 Acute stress reaction: Secondary | ICD-10-CM | POA: Insufficient documentation

## 2019-12-16 NOTE — Discharge Instructions (Signed)
If you decide to get help for your substance abuse you can look at these numbers below, return to the ER for severe worsening symptoms.  Substance Abuse Treatment Programs  Intensive Outpatient Programs [facility]     [address], [place]       [facility] [place] [address] [place], [place]  [facility]     (Inpatient and outpatient)     [address]           [place] [phone] (Suboxone and Methadone)  [place], [place] [address]      [address] [place], [place]  [facility] (Outpatient/Inpatient, [place])    (insurance only) [phone]             [facility] ([place]) [place], [place]     [facility]     [address]     [place], [place]       [facility] (for caregivers and family) [phone] [address] Kristeen [place]. [place], [place]      Residential Treatment Programs [facility]      [address] [place], [place] [address]  [phone]       [facility] [address], [place], [place] [address] [phone]  [facility] [place]        [phone]       [facility] [phone]  [facility] ([place]             [place], [place] or [place] of [place], [address] [phone]  [facility] [place]    [address]      [place], [place]       [facility] [address] [place], [place]  [place]   [address] [place],  [place] [address]     [phone]      Admissions: 8am-3pm M-F  [facility] (RTS) [address] [place], [place]  [facility] Program: Residential Program [phone] Days)   [place], [place] or [phone]     [facility]: [facility] [place], [place] (Walk in Hours over the weekend or by referral)  [facility] [place], [place], [place] [address] [phone]  Crisis Mobile: [facility]:  [phone] (for crisis response 24 hours a day) [place]:      [phone] Outpatient Psychiatry and Counseling  [facility]: Mobile Crisis Management 24 hours:  [phone]  [facility] (Outpatient Campus) of the [employer] sliding scale fee and walk in schedule: M-F 8am-12pm/1pm-3pm [place], [place] [address] [address] [place], [place] [address] [phone]  [facility] (Formerly known as [facility] [employer])- new patient walk-in appointments available Monday - Friday 8am -3pm.          [address] [place], [place] [address] [phone] or crisis line- [place] [facility]/ Intensive Outpatient Therapy Program [place], [place] [address] [place]      [phone] [address] [place], [place] [address]                 [place]   [facility] [phone]. [place], [place] [address]   [employer] of Care          [address] Brent Tran  [place], [place] [address]       [phone]  [place], [place], [place] [address] [phone]  [facility]    [address] [place], [place] [address]     [place],  [place] Brent Tran     [place] [address]     [phone]       [facility] [place] [address]  [facility]     [address], [place], [place], [place] [address] [place]     [address]     [place], [place] [address]     [phone]       [facility] [address] [place], [place] [address] [phone] Resources for Temporary Residential Assistance/Crisis [facility]) M-F 8am-3pm   [address] [place], [place] [address]   [phone] Services include: laundry, barbering, support groups, case management, phone  & computer access, showers, AA/NA mtgs, mental health/substance abuse nurse, job skills class, disability information, VA assistance, spiritual classes, etc.   [facility]   [address], [place]     [place] (women and children)       [place]. [place], [place] [address] [phone] [email or web address] for application and process Application Required  [facility]   [address]    [place] [address]     [phone]                    [place], [place] [address] [phone]([email or web address] application appt.) Application Required  [facility] (  women only)    [place], [place] [address]     [phone]      Intake starts 6pm daily Need valid ID, SSC, & Police report [employer] [address] [place], [place] [phone] Application Required  [employer] (men only)     [place].      [place],  [place]       [place] (Pregnant women only) [address] [place], [place]  [facility]      [place] Brent Tran.      [place], [place] [address]     [phone]             [facility] [address] [place], [place] 90 day commitment/SA/Application process  [facility](men only)     [address]     [place], [place]       Check-in at [facility] [address] [place], [place] [address] [phone] Men/Women/Women and Children must be there by 7 pm  [place], [place]

## 2019-12-16 NOTE — ED Provider Notes (Signed)
[facility] EMERGENCY DEPARTMENT Provider Note   CSN: [id number] Arrival date & time: 12/16/19  2019     History Chief Complaint  Patient presents with  . Psychiatric Evaluation    Brent Tran is a 49 y.o. male.  HPI    49 y/o male - mother has no information - called her to get information - but not helpful.  Thinks he has had substance abuse problems.  Per wife - Amy, [phone], no answer.  According to the involuntary commitment paperwork that he comes with in the care of the Sheriff's deputy she reports that he has been having hallucinations thinking that someone other than his wife is in the home, he states that she locked up his tools when he said he wanted to go to [facility], he reports that he was upset and threw a flashlight but it was not at her and he has no and had of hurting himself or others.  He is not hallucinating, he is not using drugs it is been more than 1 week since he has had anything.  The spouse that filled out the involuntary commitment paperwork was found to have outstanding warrants and was taken to jail, the patient is here very calm collected and states that he does not want to hurt himself or others and is not hallucinating.  Past Medical History:  Diagnosis Date  . 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   Past Surgical History:  Procedure Laterality Date  . TONSILLECTOMY    . TUMOR REMOVAL         Family History  Problem Relation Age of Onset  . Heart disease Father   . Cancer Father     Social History   Tobacco Use  . Smoking status: Current Every Day Smoker    Packs/day: 0.50    Types: Cigarettes  . Smokeless tobacco: Never Used  Vaping Use  . Vaping Use: Never used  Substance Use Topics  . Alcohol use: Never  . Drug use: Not Currently    Home Medications Prior to Admission medications   Medication Sig Start Date End Date Taking? Authorizing Provider  HYDROcodone-acetaminophen  (NORCO/VICODIN) 5-325 MG tablet Take 1-2 tablets by mouth every 6 (six) hours as needed for moderate pain. 12/01/19   Lajean Saver, MD  naproxen sodium (ALEVE) 220 MG tablet Take 220 mg by mouth 2 (two) times daily as needed (pain).    [provider]    Allergies    Ultram [tramadol hcl] and Penicillins  Review of Systems   Review of Systems  All other systems reviewed and are negative.   Physical Exam Updated Vital Signs BP 136/85 (BP Location: Right Arm)   Pulse 98   Temp 97.8 F (36.6 C) (Oral)   Resp 18   Ht 1.778 m (5\' 10" )   Wt 77.1 kg   SpO2 99%   BMI 24.39 kg/m   Physical Exam Vitals and nursing note reviewed.  Constitutional:      General: He is not in acute distress.    Appearance: He is well-developed.  HENT:     Head: Normocephalic and atraumatic.     Mouth/Throat:     Pharynx: No oropharyngeal exudate.  Eyes:     General: No scleral icterus.       Right eye: No discharge.        Left eye: No discharge.     Conjunctiva/sclera: Conjunctivae normal.  Pupils: Pupils are equal, round, and reactive to light.  Neck:     Thyroid: No thyromegaly.     Vascular: No JVD.  Cardiovascular:     Rate and Rhythm: Normal rate and regular rhythm.     Heart sounds: Normal heart sounds. No murmur heard.  No friction rub. No gallop.   Pulmonary:     Effort: Pulmonary effort is normal. No respiratory distress.     Breath sounds: Normal breath sounds. No wheezing or rales.  Abdominal:     General: Bowel sounds are normal. There is no distension.     Palpations: Abdomen is soft. There is no mass.     Tenderness: There is no abdominal tenderness.  Musculoskeletal:        General: No tenderness. Normal range of motion.     Cervical back: Normal range of motion and neck supple.  Lymphadenopathy:     Cervical: No cervical adenopathy.  Skin:    General: Skin is warm and dry.     Findings: No erythema or rash.  Neurological:     Mental Status: He is alert.       Coordination: Coordination normal.  Psychiatric:        Behavior: Behavior normal.     Comments: The patient has good insight, he is cleared his thought process and has good judgment.  He is not hallucinating or responding to internal stimuli and is not suicidal or homicidal     ED Results / Procedures / Treatments   Labs (all labs ordered are listed, but only abnormal results are displayed) Labs Reviewed - No data to display  EKG None  Radiology No results found.  Procedures Procedures (including critical care time)  Medications Ordered in ED Medications - No data to display  ED Course  I have reviewed the triage vital signs and the nursing notes.  Pertinent labs & imaging results that were available during my care of the patient were reviewed by me and considered in my medical decision making (see chart for details).    MDM Rules/Calculators/A&P                          This patient is stable for discharge, his involuntary commitment will be overturned and he will be released, he does not appear to be a danger to himself or others  Final Clinical Impression(s) / ED Diagnoses Final diagnoses:  Acute stress reaction    Rx / DC Orders ED Discharge Orders    None       Noemi Chapel, MD 12/16/19 2057

## 2019-12-16 NOTE — ED Triage Notes (Signed)
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  States that he got into an argument with his wife tonight because he wanted to leave and go to [facility], states that his wife went to the pd and said that he was hearing voices.

## 2021-08-31 ENCOUNTER — Encounter: Payer: Self-pay | Admitting: Physician Assistant

## 2021-08-31 ENCOUNTER — Telehealth: Payer: Self-pay

## 2021-08-31 ENCOUNTER — Ambulatory Visit: Payer: Self-pay | Admitting: Physician Assistant

## 2021-08-31 VITALS — BP 123/74 | HR 70 | Temp 97.2°F | Ht 69.0 in | Wt 194.0 lb

## 2021-08-31 DIAGNOSIS — M7712 Lateral epicondylitis, left elbow: Secondary | ICD-10-CM

## 2021-08-31 DIAGNOSIS — Z13220 Encounter for screening for lipoid disorders: Secondary | ICD-10-CM

## 2021-08-31 DIAGNOSIS — Z131 Encounter for screening for diabetes mellitus: Secondary | ICD-10-CM

## 2021-08-31 DIAGNOSIS — Z7689 Persons encountering health services in other specified circumstances: Secondary | ICD-10-CM

## 2021-08-31 DIAGNOSIS — Z125 Encounter for screening for malignant neoplasm of prostate: Secondary | ICD-10-CM

## 2021-08-31 DIAGNOSIS — K029 Dental caries, unspecified: Secondary | ICD-10-CM

## 2021-08-31 DIAGNOSIS — F1911 Other psychoactive substance abuse, in remission: Secondary | ICD-10-CM

## 2021-08-31 DIAGNOSIS — F17201 Nicotine dependence, unspecified, in remission: Secondary | ICD-10-CM

## 2021-08-31 DIAGNOSIS — Z1159 Encounter for screening for other viral diseases: Secondary | ICD-10-CM

## 2021-08-31 DIAGNOSIS — Z1211 Encounter for screening for malignant neoplasm of colon: Secondary | ICD-10-CM

## 2021-08-31 NOTE — Telephone Encounter (Signed)
Called to follow up with client after his first appointment with [facility]. He states everything went well. He knows that he is to get labs drawn. He reports he is sleeping well and feels "I'm good with my mental health right now once I got my head straight". He has no questions at this time for me.  Food: we discussed that he and his wife may shop [place] as needed weekly during our regular office hours.  No other needs at this time.  Nettie [employer]

## 2021-08-31 NOTE — Patient Instructions (Signed)
Tennis Elbow  Tennis elbow (lateral epicondylitis) is inflammation of tendons in your outer forearm, near your elbow. Tendons are tissues that connect muscle to bone. When you have tennis elbow, inflammation affects the tendons that you use to bend your wrist and move your hand up. Inflammation occurs in the lower part of the upper arm bone (humerus), where the tendons connect to the bone (lateral epicondyle). Tennis elbow often affects people who play tennis, but anyone may get the condition from repeatedly extending the wrist or turning the forearm. What are the causes? This condition is usually caused by repeatedly extending the wrist, turning the forearm, and using the hands. It can result from sports or work that requires repetitive forearm movements. In some cases, it may be caused by a sudden injury. What increases the risk? You are more likely to develop tennis elbow if you play tennis or another racket sport. You also have a higher risk if you frequently use your hands for work. Besides people who play tennis, others at greater risk include: People who use computers. Construction workers. People who work in factories. Musicians. Cooks. Cashiers. What are the signs or symptoms? Symptoms of this condition include: Pain and tenderness in the forearm and the outer part of the elbow. Pain may be felt only when using the arm, or it may be there all the time. A burning feeling that starts in the elbow and spreads down the forearm. A weak grip in the hand. How is this diagnosed? This condition is diagnosed based on your symptoms, your medical history, and a physical exam. You may also have X-rays or an MRI to: Confirm the diagnosis. Look for other issues. Check for tears in the ligaments, muscles, or tendons. How is this treated? Resting and icing your arm is often the first treatment. Your health care provider may also recommend: Medicines to reduce pain and inflammation. These may be in  the form of a pill, topical gels, or shots of a steroid medicine (cortisone). An elbow strap to reduce stress on the area. Physical therapy. This may include massage or exercises or both. An elbow brace to restrict the movements that cause symptoms. If these treatments do not help relieve your symptoms, your health care provider may recommend surgery to remove damaged muscle and reattach healthy muscle to bone. Follow these instructions at home: If you have a brace or strap: Wear the brace or strap as told by your health care provider. Remove it only as told by your health care provider. Check the skin around the brace or strap every day. Tell your health care provider about any concerns. Loosen the brace if your fingers tingle, become numb, or turn cold and blue. Keep the brace clean. If the brace or strap is not waterproof: Do not let it get wet. Cover it with a watertight covering when you take a bath or a shower. Managing pain, stiffness, and swelling  If directed, put ice on the injured area. To do this: If you have a removable brace or strap, remove it as told by your health care provider. Put ice in a plastic bag. Place a towel between your skin and the bag. Leave the ice on for 20 minutes, 2-3 times a day. Remove the ice if your skin turns bright red. This is very important. If you cannot feel pain, heat, or cold, you have a greater risk of damage to the area. Move your fingers often to reduce stiffness and swelling. Activity Rest your elbow   and wrist and avoid activities that cause symptoms as told by your health care provider. Do physical therapy exercises as told by your health care provider. If you lift an object, lift it with your palm facing up. This reduces stress on your elbow. Lifestyle If your tennis elbow is caused by sports, check your equipment and make sure that: You use it correctly. It is good match for you. If your tennis elbow is caused by work or computer  use, take frequent breaks to stretch your arm. Talk with your employer about ways to manage your condition at work. General instructions Take over-the-counter and prescription medicines only as told by your health care provider. Do not use any products that contain nicotine or tobacco. These products include cigarettes, chewing tobacco, and vaping devices, such as e-cigarettes. If you need help quitting, ask your health care provider. Keep all follow-up visits. This is important. How is this prevented? Before and after activity: Warm up and stretch before being active. Cool down and stretch after being active. Give your body time to rest between periods of activity. During activity: Make sure to use equipment that fits you. If you play tennis, put power in your stroke with your lower body. Avoid using your arm only. Maintain physical fitness, including: Strength. Flexibility. Endurance. Do exercises to strengthen the forearm muscles. Contact a health care provider if: You have pain that gets worse or does not get better with treatment. You have numbness or weakness in your forearm, hand, or fingers. Get help right away if: Your pain is severe. You cannot move your wrist. Summary Tennis elbow (lateral epicondylitis) is inflammation of tendons in your outer forearm, near your elbow. Common symptoms include pain and tenderness in your forearm and the outer part of your elbow. This condition is usually caused by repeatedly extending your wrist, turning your forearm, and using your hands. The first treatment is often resting and icing your arm to relieve symptoms. Further treatment may include taking medicine, getting physical therapy, wearing a brace or strap, or having surgery. This information is not intended to replace advice given to you by your health care provider. Make sure you discuss any questions you have with your health care provider. Document Revised: 07/17/2019 Document  Reviewed: 07/17/2019 Elsevier Patient Education  2023 Elsevier Inc.  

## 2021-08-31 NOTE — Progress Notes (Unsigned)
BP 123/74   Pulse 70   Temp (!) 97.2 F (36.2 C)   SpO2 97%    Subjective:    Patient ID: Brent Tran, male    DOB: November 25, 1970, 51 y.o.   MRN: [id number]  HPI: Brent Tran is a 51 y.o. 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  Treated in [place], [place] then in [place], [place].   He is trying to stop smoking and has gone 4 days without  Pt has history substance abuse.  He is going to [facility] and AA.    He says he has had No drugs 120 days- since April   Pt has history of depression and  struggling with MH.  he went to [facility]- years ago.  He says currently he has No hallucinations, depression, anxiety.    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  He does not have regular employment.   He has 4 adult sons who live in [place].  The youngest is 3.   Pt c/o some pain in the left shoulder and left elbow.   He C/o left shoulder pain-   bothers him quite a bit.  He says it's his rotator cuff-  also left elbow problems.  He is left-hand dominant. He says he has "swung a hammer" with that arm for a lot.   He says he has No problems with breathing.   Pt says his wife tells him that while he is sleeping, he stops breathing for 13 or more seconds and then gasps for breathing.   He says he feels tired during the day.     He says he has a lot of bad teeth that need a dentist.     Relevant past medical, surgical, family and social history reviewed and updated as indicated. Interim medical history since our last visit reviewed. Allergies and medications reviewed and updated.     Current Outpatient Medications:    QUEtiapine Fumarate (SEROQUEL PO), Take by mouth., Disp: , Rfl:    Review of Systems  Per HPI unless specifically indicated above     Objective:    BP 123/74   Pulse  70   Temp (!) 97.2 F (36.2 C)   SpO2 97%   Wt Readings from Last 3 Encounters:  12/16/19 170 lb (77.1 kg)  12/01/19 170 lb (77.1 kg)  03/22/19 150 lb (68 kg)    Physical Exam Vitals reviewed.  Constitutional:      General: He is not in acute distress.    Appearance: He is well-developed. He is not toxic-appearing.  HENT:     Head: Normocephalic and atraumatic.     Right Ear: Tympanic membrane, ear canal and external ear normal.     Left Ear: Tympanic membrane, ear canal and external ear normal.  Eyes:     Extraocular Movements: Extraocular movements intact.     Conjunctiva/sclera: Conjunctivae normal.     Pupils: Pupils are equal, round, and reactive to light.  Neck:     Thyroid: No thyromegaly.  Cardiovascular:     Rate and Rhythm: Normal rate and regular rhythm.  Pulmonary:     Effort: Pulmonary effort is normal.     Breath sounds: Normal breath sounds. No wheezing or rales.  Abdominal:  General: Bowel sounds are normal.     Palpations: Abdomen is soft. There is no mass.     Tenderness: There is no abdominal tenderness.  Musculoskeletal:     Right shoulder: Normal range of motion.     Left shoulder: No swelling, deformity or bony tenderness. Normal range of motion.     Left elbow: Normal range of motion. Tenderness present in lateral epicondyle. No radial head, medial epicondyle or olecranon process tenderness.     Cervical back: Neck supple.     Right lower leg: No edema.     Left lower leg: No edema.  Lymphadenopathy:     Cervical: No cervical adenopathy.  Skin:    General: Skin is warm and dry.     Findings: No rash.  Neurological:     Mental Status: He is alert and oriented to person, place, and time.     Motor: No weakness or tremor.     Gait: Gait is intact.  Psychiatric:        Attention and Perception: Attention normal.        Behavior: Behavior normal. Behavior is cooperative.           Assessment & Plan:    Encounter Diagnoses  Name  Primary?   Encounter to establish care Yes   Substance abuse in remission ([place])    Left lateral epicondylitis    Screening cholesterol level    Need for hepatitis C screening test    Tobacco abuse, in remission    Screening for colon cancer    Screening for prostate cancer    Dental decay    Screening for diabetes mellitus      -pt was educated and encouraged to get covid vaccination -will update Labs -pt is given FIT test for colon cancer screening -record request sent to [facility]rology for any records related to his history of cancer -discussed left Elbow and epicondylitis.  Discussed he should rest the elbow, apply ice 10-20 minutes 4 times daily and after any heavy use -pt is put on Dental list -pt congratulated on efforts to stay off drugs and cigarettes.  He is encouraged to continue to go to NA/AA -pt to follow up 1 month.  He is to contact office sooner prn

## 2021-09-02 ENCOUNTER — Other Ambulatory Visit: Payer: Self-pay | Admitting: Physician Assistant

## 2021-09-02 DIAGNOSIS — Z1211 Encounter for screening for malignant neoplasm of colon: Secondary | ICD-10-CM

## 2021-09-03 ENCOUNTER — Other Ambulatory Visit (HOSPITAL_COMMUNITY)
Admission: RE | Admit: 2021-09-03 | Discharge: 2021-09-03 | Disposition: A | Discharge status: Home / Self Care | Payer: Self-pay | Source: Ambulatory Visit | Attending: Physician Assistant | Admitting: Physician Assistant

## 2021-09-03 DIAGNOSIS — F1911 Other psychoactive substance abuse, in remission: Secondary | ICD-10-CM | POA: Insufficient documentation

## 2021-09-03 DIAGNOSIS — Z13220 Encounter for screening for lipoid disorders: Secondary | ICD-10-CM | POA: Insufficient documentation

## 2021-09-03 DIAGNOSIS — Z1159 Encounter for screening for other viral diseases: Secondary | ICD-10-CM | POA: Insufficient documentation

## 2021-09-03 DIAGNOSIS — Z125 Encounter for screening for malignant neoplasm of prostate: Secondary | ICD-10-CM | POA: Insufficient documentation

## 2021-09-03 DIAGNOSIS — Z131 Encounter for screening for diabetes mellitus: Secondary | ICD-10-CM | POA: Insufficient documentation

## 2021-09-03 LAB — LIPID PANEL
Cholesterol: 181 mg/dL (ref 0–200)
HDL: 60 mg/dL (ref >40)
LDL Cholesterol: 107 mg/dL — ABNORMAL HIGH (ref 0–99)
Total CHOL/HDL Ratio: 3 RATIO
Triglycerides: 69 mg/dL (ref <150)
VLDL: 14 mg/dL (ref 0–40)

## 2021-09-03 LAB — PSA: Prostatic Specific Antigen: 1.16 ng/mL (ref 0.00–4.00)

## 2021-09-03 LAB — COMPREHENSIVE METABOLIC PANEL
ALT: 40 U/L (ref 0–44)
AST: 27 U/L (ref 15–41)
Albumin: 4.1 g/dL (ref 3.5–5.0)
Alkaline Phosphatase: 63 U/L (ref 38–126)
Anion gap: 5 (ref 5–15)
BUN: 18 mg/dL (ref 6–20)
CO2: 29 mmol/L (ref 22–32)
Calcium: 9.4 mg/dL (ref 8.9–10.3)
Chloride: 105 mmol/L (ref 98–111)
Creatinine, Ser: 0.83 mg/dL (ref 0.61–1.24)
GFR, Estimated: >60 mL/min (ref >60)
Glucose, Bld: 104 mg/dL — ABNORMAL HIGH (ref 70–99)
Potassium: 4.3 mmol/L (ref 3.5–5.1)
Sodium: 139 mmol/L (ref 135–145)
Total Bilirubin: 0.3 mg/dL (ref 0.3–1.2)
Total Protein: 7.2 g/dL (ref 6.5–8.1)

## 2021-09-03 LAB — HEPATITIS PANEL, ACUTE
HCV Ab: NONREACTIVE
Hep A IgM: NONREACTIVE
Hep B C IgM: NONREACTIVE
Hepatitis B Surface Ag: NONREACTIVE

## 2021-09-03 LAB — HEMOGLOBIN A1C
Hgb A1c MFr Bld: 5.6 % (ref 4.8–5.6)
Mean Plasma Glucose: 114.02 mg/dL

## 2021-09-03 LAB — POC FIT TEST STOOL: Fecal Occult Blood: NEGATIVE

## 2021-09-29 ENCOUNTER — Ambulatory Visit: Payer: Self-pay | Admitting: Physician Assistant

## 2021-10-15 ENCOUNTER — Ambulatory Visit: Payer: Self-pay | Admitting: Physician Assistant

## 2021-10-15 ENCOUNTER — Encounter: Payer: Self-pay | Admitting: Physician Assistant

## 2021-10-15 VITALS — BP 110/70 | HR 61 | Temp 98.0°F | Ht 69.0 in | Wt 197.5 lb

## 2021-10-15 DIAGNOSIS — Z8551 Personal history of malignant neoplasm of bladder: Secondary | ICD-10-CM

## 2021-10-15 NOTE — Progress Notes (Signed)
BP 110/70   Pulse 61   Temp 98 F (36.7 C)   Ht '5\' 9"'$  (1.753 m)   Wt 197 lb 8 oz (89.6 kg)   SpO2 98%   BMI 29.17 kg/m    Subjective:    Patient ID: Brent Tran, male    DOB: 1970-10-04, 51 y.o.   MRN: [id number]  HPI: Brent Tran is a 51 y.o. male presenting on 10/15/2021 for Follow-up   HPI   Pt is 13yoM who presents for follow up.   He says he is doing well. He says he has No anxiety or depression.  He has been stopped smoking about a month.  He is still abstaining from substance abuse.  He says he feels good and has no complaints.    Records received from [facility] where pt received some treatment for bladder cancer (second instance).   Records from his initial treatment are unavailable.   Pt says he has not noticed any blood in urine recently or other problems.     Relevant past medical, surgical, family and social history reviewed and updated as indicated. Interim medical history since our last visit reviewed. Allergies and medications reviewed and updated.   Current Outpatient Medications:    QUEtiapine Fumarate (SEROQUEL PO), Take by mouth. (Patient not taking: Reported on 10/15/2021), Disp: , Rfl:     Review of Systems  Per HPI unless specifically indicated above     Objective:    BP 110/70   Pulse 61   Temp 98 F (36.7 C)   Ht '5\' 9"'$  (1.753 m)   Wt 197 lb 8 oz (89.6 kg)   SpO2 98%   BMI 29.17 kg/m   Wt Readings from Last 3 Encounters:  10/15/21 197 lb 8 oz (89.6 kg)  08/31/21 194 lb (88 kg)  12/16/19 170 lb (77.1 kg)    Physical Exam Vitals reviewed.  Constitutional:      General: He is not in acute distress.    Appearance: He is well-developed. He is not toxic-appearing.  HENT:     Head: Normocephalic and atraumatic.  Cardiovascular:     Rate and Rhythm: Normal rate and regular rhythm.  Pulmonary:     Effort: Pulmonary effort is normal.     Breath sounds: Normal breath sounds. No wheezing.  Abdominal:     General: Bowel  sounds are normal.     Palpations: Abdomen is soft.     Tenderness: There is no abdominal tenderness.  Musculoskeletal:     Cervical back: Neck supple.     Right lower leg: No edema.     Left lower leg: No edema.  Lymphadenopathy:     Cervical: No cervical adenopathy.  Skin:    General: Skin is warm and dry.  Neurological:     Mental Status: He is alert and oriented to person, place, and time.  Psychiatric:        Behavior: Behavior normal.     Results for orders placed or performed during the hospital encounter of 09/03/21  Lipid panel  Result Value Ref Range   Cholesterol 181 0 - 200 mg/dL   Triglycerides 69 <150 mg/dL   HDL 60 >40 mg/dL   Total CHOL/HDL Ratio 3.0 RATIO   VLDL 14 0 - 40 mg/dL   LDL Cholesterol 107 (H) 0 - 99 mg/dL  PSA  Result Value Ref Range   Prostatic Specific Antigen 1.16 0.00 - 4.00 ng/mL  Hemoglobin A1c  Result Value Ref Range  Hgb A1c MFr Bld 5.6 4.8 - 5.6 %   Mean Plasma Glucose 114.02 mg/dL  Hepatitis, Acute  Result Value Ref Range   Hepatitis B Surface Ag NON REACTIVE NON REACTIVE   HCV Ab NON REACTIVE NON REACTIVE   Hep A IgM NON REACTIVE NON REACTIVE   Hep B C IgM NON REACTIVE NON REACTIVE  Comprehensive metabolic panel  Result Value Ref Range   Sodium 139 135 - 145 mmol/L   Potassium 4.3 3.5 - 5.1 mmol/L   Chloride 105 98 - 111 mmol/L   CO2 29 22 - 32 mmol/L   Glucose, Bld 104 (H) 70 - 99 mg/dL   BUN 18 6 - 20 mg/dL   Creatinine, Ser 0.83 0.61 - 1.24 mg/dL   Calcium 9.4 8.9 - 10.3 mg/dL   Total Protein 7.2 6.5 - 8.1 g/dL   Albumin 4.1 3.5 - 5.0 g/dL   AST 27 15 - 41 U/L   ALT 40 0 - 44 U/L   Alkaline Phosphatase 63 38 - 126 U/L   Total Bilirubin 0.3 0.3 - 1.2 mg/dL   GFR, Estimated >60 >60 mL/min   Anion gap 5 5 - 15      Assessment & Plan:    Encounter Diagnosis  Name Primary?   History of bladder cancer Yes     -Reviewed labs with pt -will Refer to urology for input on whether pt needs interval testing in light  of history bladder cancer -pt is given application for [employer] financial assistance -pt will be scheduled to follow up next year.  He is to contact office sooner prn

## 2021-10-21 ENCOUNTER — Ambulatory Visit: Payer: Self-pay | Admitting: Urology

## 2021-10-21 DIAGNOSIS — Z8551 Personal history of malignant neoplasm of bladder: Secondary | ICD-10-CM

## 2022-03-10 IMAGING — DX DG RIBS W/ CHEST 3+V*L*
4 series · 4 of 4 positions shown · non-contrast
Comparison: None.

CLINICAL DATA: Fell off a ladder 3 days ago.  LEFT-sided chest pain

EXAM:
LEFT RIBS AND CHEST - 3+ VIEW

[chest pa]
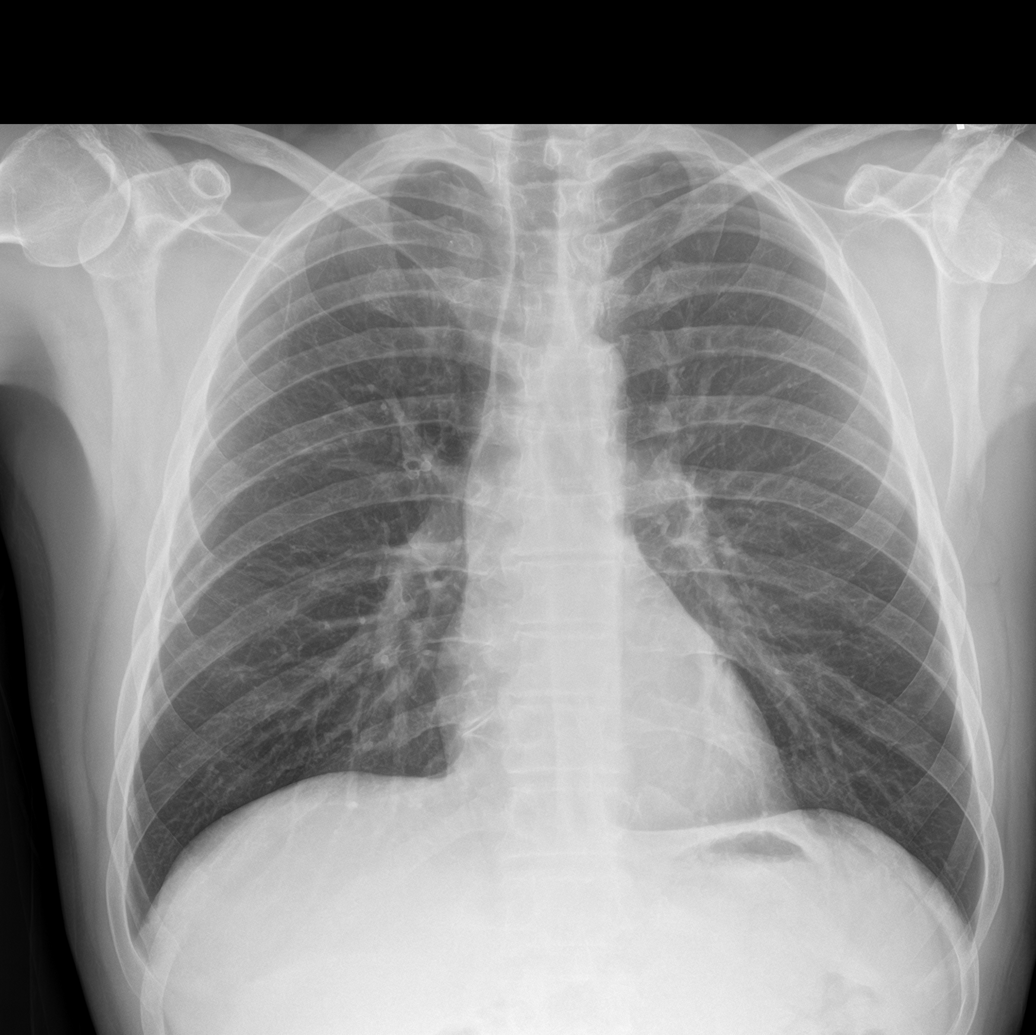

[rib pa obl]
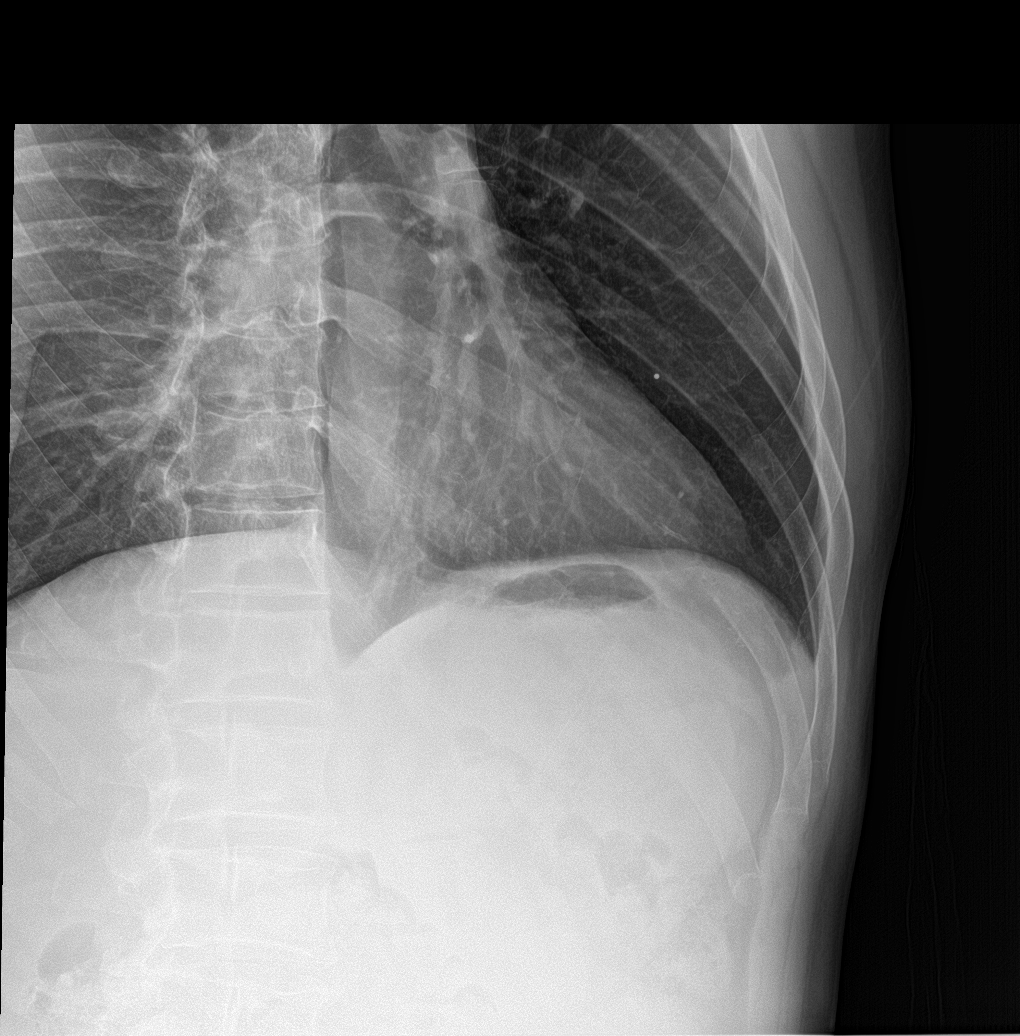

[rib pa]
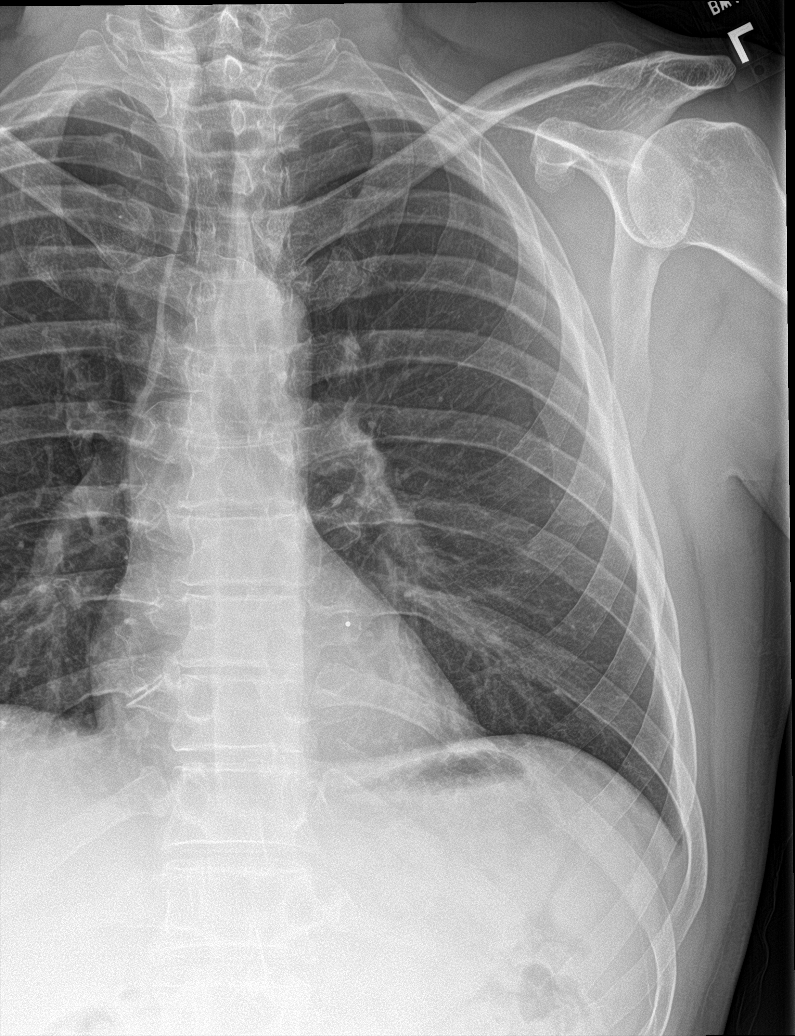

[chest ap]
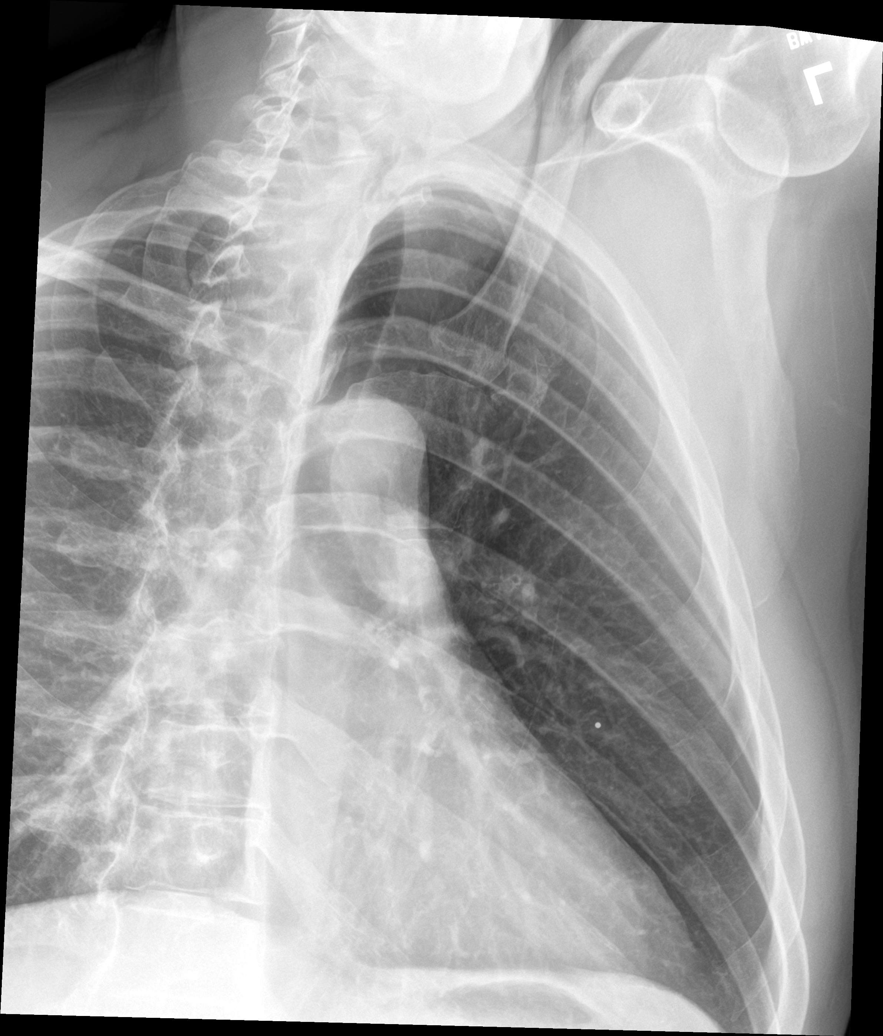

[4 of 4 positions shown; findings below may reference images not displayed]

FINDINGS: Mild cortical irregularity of the LEFT anterolateral fifth and sixth
ribs may reflect nondisplaced rib fractures. No displaced rib
fractures are seen. No pneumothorax, focal consolidation or pleural
effusion. Normal cardiomediastinal silhouette.
IMPRESSION: Possible nondisplaced LEFT anterolateral fifth and sixth rib
fractures. No pneumothorax. Correlate with point tenderness

## 2022-03-24 ENCOUNTER — Telehealth: Payer: Self-pay

## 2022-04-27 ENCOUNTER — Ambulatory Visit: Payer: Self-pay | Admitting: Physician Assistant

## 2022-04-28 ENCOUNTER — Ambulatory Visit: Payer: Self-pay | Admitting: Physician Assistant

## 2022-04-28 ENCOUNTER — Encounter: Payer: Self-pay | Admitting: Physician Assistant

## 2022-04-28 VITALS — BP 113/73 | HR 71 | Temp 97.5°F | Wt 196.0 lb

## 2022-04-28 DIAGNOSIS — Z8551 Personal history of malignant neoplasm of bladder: Secondary | ICD-10-CM

## 2022-04-28 DIAGNOSIS — K029 Dental caries, unspecified: Secondary | ICD-10-CM

## 2022-04-28 DIAGNOSIS — K0889 Other specified disorders of teeth and supporting structures: Secondary | ICD-10-CM

## 2022-04-28 MED ORDER — CLINDAMYCIN HCL 300 MG PO CAPS
300.0000 mg | ORAL_CAPSULE | Freq: Four times a day (QID) | ORAL | 0 refills | Status: AC
Start: 2022-04-28 — End: 2022-05-05

## 2022-04-28 NOTE — Progress Notes (Signed)
BP 113/73   Pulse 71   Temp (!) 97.5 F (36.4 C)   Wt 196 lb (88.9 kg)   SpO2 95%   BMI 28.94 kg/m    Subjective:    Patient ID: Brent Tran, male    DOB: December 27, 1970, 52 y.o.   MRN: [id number]  HPI: Brent Tran is a 52 y.o. male presenting on 04/28/2022 for No chief complaint on file.   HPI   Pt was Last seen 09/2021.  He has history of bladder cancer.  Pt was referred to urology and was no-show to appt 10/21/21.   Pt says his Mood is fine  Pt says he has been sick since Monday.    Sick with ST.  He  denies congestion but sounds congested.  Pt was eating a potato chip and then tooth hurt and then lymph node swelled.  Pt is Not aware of any fevers.  He denies apap/ibu use.  He feels weird and tired.  His tooth hurts and his jaw has pressure.  He says the tooth is gone but he thinks the root is still there.  He says it just fell off by itself.   He says he has A little bit of coughing.   Also Some laryngitis.    Pt already got referral to dentist - note with [facility] 03/24/22.  Pt says he thinks he missed his appointment.   His applied for medicaid he thinks 30 or 40 days ago.   He hasn't heard if that has started yet or not.         Relevant past medical, surgical, family and social history reviewed and updated as indicated. Interim medical history since our last visit reviewed. Allergies and medications reviewed and updated.  No current outpatient medications on file.   Review of Systems  Per HPI unless specifically indicated above     Objective:    BP 113/73   Pulse 71   Temp (!) 97.5 F (36.4 C)   Wt 196 lb (88.9 kg)   SpO2 95%   BMI 28.94 kg/m   Wt Readings from Last 3 Encounters:  04/28/22 196 lb (88.9 kg)  10/15/21 197 lb 8 oz (89.6 kg)  08/31/21 194 lb (88 kg)    Physical Exam HENT:     Head: Normocephalic and atraumatic.     Mouth/Throat:     Mouth: Mucous membranes are moist.     Dentition: Abnormal dentition. Dental tenderness and  dental caries present. No dental abscesses.     Pharynx: Oropharynx is clear.  Neck:     Comments: Pt has some mild shotty nodes right neck below jaw Cardiovascular:     Rate and Rhythm: Normal rate and regular rhythm.  Pulmonary:     Effort: Pulmonary effort is normal. No respiratory distress.     Breath sounds: Normal breath sounds. No wheezing or rhonchi.  Musculoskeletal:     Cervical back: Neck supple.  Skin:    General: Skin is warm and dry.  Neurological:     Mental Status: He is alert and oriented to person, place, and time.  Psychiatric:        Behavior: Behavior normal.            Assessment & Plan:    Encounter Diagnoses  Name Primary?   Dentalgia Yes   Dental decay    History of bladder cancer      -rx clindamycin and refer to dentist -pt is given phone number for  urologist to call to see if they will reschedule him -pt has follow-up here in August.  He is to contact office sooner prn

## 2022-04-28 NOTE — Patient Instructions (Addendum)
Urologist [phone] -call to reschedule

## 2022-04-29 ENCOUNTER — Encounter: Payer: Self-pay | Admitting: Physician Assistant

## 2022-04-29 ENCOUNTER — Telehealth: Payer: Self-pay

## 2022-04-29 ENCOUNTER — Ambulatory Visit (HOSPITAL_COMMUNITY)
Admission: RE | Admit: 2022-04-29 | Discharge: 2022-04-29 | Disposition: A | Discharge status: Home / Self Care | Payer: Medicaid Other | Source: Ambulatory Visit | Attending: Physician Assistant | Admitting: Physician Assistant

## 2022-04-29 ENCOUNTER — Ambulatory Visit: Payer: Self-pay | Admitting: Physician Assistant

## 2022-04-29 ENCOUNTER — Other Ambulatory Visit (HOSPITAL_COMMUNITY)
Admission: RE | Admit: 2022-04-29 | Discharge: 2022-04-29 | Disposition: A | Discharge status: Home / Self Care | Payer: Medicaid Other | Source: Ambulatory Visit | Attending: Physician Assistant | Admitting: Physician Assistant

## 2022-04-29 DIAGNOSIS — R6883 Chills (without fever): Secondary | ICD-10-CM | POA: Insufficient documentation

## 2022-04-29 DIAGNOSIS — R110 Nausea: Secondary | ICD-10-CM

## 2022-04-29 DIAGNOSIS — R051 Acute cough: Secondary | ICD-10-CM

## 2022-04-29 DIAGNOSIS — J029 Acute pharyngitis, unspecified: Secondary | ICD-10-CM

## 2022-04-29 DIAGNOSIS — K0889 Other specified disorders of teeth and supporting structures: Secondary | ICD-10-CM

## 2022-04-29 LAB — CBC WITH DIFFERENTIAL/PLATELET
Abs Immature Granulocytes: 0.03 10*3/uL (ref 0.00–0.07)
Basophils Absolute: 0.1 10*3/uL (ref 0.0–0.1)
Basophils Relative: 1 %
Eosinophils Absolute: 0.5 10*3/uL (ref 0.0–0.5)
Eosinophils Relative: 5 %
HCT: 43.2 % (ref 39.0–52.0)
Hemoglobin: 14.8 g/dL (ref 13.0–17.0)
Immature Granulocytes: 0 %
Lymphocytes Relative: 22 %
Lymphs Abs: 2.5 10*3/uL (ref 0.7–4.0)
MCH: 30 pg (ref 26.0–34.0)
MCHC: 34.3 g/dL (ref 30.0–36.0)
MCV: 87.6 fL (ref 80.0–100.0)
Monocytes Absolute: 0.7 10*3/uL (ref 0.1–1.0)
Monocytes Relative: 6 %
Neutro Abs: 7.7 10*3/uL (ref 1.7–7.7)
Neutrophils Relative %: 66 %
Platelets: 400 10*3/uL (ref 150–400)
RBC: 4.93 MIL/uL (ref 4.22–5.81)
RDW: 12.9 % (ref 11.5–15.5)
WBC: 11.4 10*3/uL — ABNORMAL HIGH (ref 4.0–10.5)
nRBC: 0 % (ref 0.0–0.2)

## 2022-04-29 LAB — BASIC METABOLIC PANEL
Anion gap: 9 (ref 5–15)
BUN: 11 mg/dL (ref 6–20)
CO2: 24 mmol/L (ref 22–32)
Calcium: 9.3 mg/dL (ref 8.9–10.3)
Chloride: 101 mmol/L (ref 98–111)
Creatinine, Ser: 0.9 mg/dL (ref 0.61–1.24)
GFR, Estimated: >60 mL/min (ref >60)
Glucose, Bld: 144 mg/dL — ABNORMAL HIGH (ref 70–99)
Potassium: 3.7 mmol/L (ref 3.5–5.1)
Sodium: 134 mmol/L — ABNORMAL LOW (ref 135–145)

## 2022-04-29 NOTE — Progress Notes (Signed)
There were no vitals taken for this visit.   Subjective:    Patient ID: Brent Tran, male    DOB: 1970/11/06, 52 y.o.   MRN: [id number]  HPI: Brent Tran is a 52 y.o. male presenting on 04/29/2022 for No chief complaint on file.   HPI  This is a telemedicine appointment through [employer]  I connected with  Aleene Davidson on 04/29/22 by a video enabled telemedicine application and verified that I am speaking with the correct person using two identifiers.   I discussed the limitations of evaluation and management by telemedicine. The patient expressed understanding and agreed to proceed.  Pt is at home.  Provider is at office.   Pt was seen in office yesterday and prescribed clindamycin for his painful decaying teeth.  Rapid referral to dentis requested.   Pt's wife contacted office today about the pt.     Pt says he Feels some nausea.  He took 4 doses of clindamycin yesterday and one today.Marland Kitchen    He reports No fever (checked by wrist)   but feels chilled and sweats.    He has No swelling of jaws but feels puffy under his eyes.  His wife says he complains of his breathing a lot.  Pt says he Feels like his lungs are burning.   He says he has A little bit of cough and it hurts when he coughs.   He has not yet eaten today.  Last time he Ate was last night about 2am.   He has drank some coffeee this morning.    Compared to yesterday pt says he feels about the same.  He says His lymph node is improved.  His Throat is still sore.      Relevant past medical, surgical, family and social history reviewed and updated as indicated. Interim medical history since our last visit reviewed. Allergies and medications reviewed and updated.   Current Outpatient Medications:    clindamycin (CLEOCIN) 300 MG capsule, Take 1 capsule (300 mg total) by mouth 4 (four) times daily for 7 days., Disp: 28 capsule, Rfl: 0    Review of Systems  Per HPI unless specifically indicated above      Objective:    There were no vitals taken for this visit.  Wt Readings from Last 3 Encounters:  04/28/22 196 lb (88.9 kg)  10/15/21 197 lb 8 oz (89.6 kg)  08/31/21 194 lb (88 kg)    Physical Exam Constitutional:      General: He is not in acute distress.    Appearance: He is not toxic-appearing.  HENT:     Head: Normocephalic and atraumatic.     Comments: No swelling of that face in the jaw area.   Pulmonary:     Effort: Pulmonary effort is normal. No respiratory distress.     Comments: Pt is talking in complete sentences without sob.  He is not seen to cough during entirety of appointment.  Neurological:     Mental Status: He is alert and oriented to person, place, and time.  Psychiatric:        Behavior: Behavior normal.           Assessment & Plan:    Encounter Diagnoses  Name Primary?   Dentalgia Yes   Acute cough    Chills    Nausea    Sore throat      -pt needs to continue the clindamycin prescribed yesterday for his teeth.  Will check Labs ad cxr.  Pt is encouraged to refrain from smoking.  Recommended warm salt water gargles 3 or 4 times daily, rest, fluids.  Will call with results

## 2022-04-29 NOTE — Telephone Encounter (Signed)
Pt has dental appointment at [facility] dental clinic on Monday, 05/03/22 at 8 am to establish care and receive dental services through the [facility] program.

## 2022-05-18 ENCOUNTER — Ambulatory Visit: Payer: Medicaid Other | Admitting: Internal Medicine

## 2022-05-24 ENCOUNTER — Ambulatory Visit (INDEPENDENT_AMBULATORY_CARE_PROVIDER_SITE_OTHER): Payer: Medicaid Other | Admitting: Internal Medicine

## 2022-05-24 ENCOUNTER — Encounter: Payer: Self-pay | Admitting: Internal Medicine

## 2022-05-24 VITALS — BP 125/65 | HR 72 | Ht 68.5 in | Wt 195.1 lb

## 2022-05-24 DIAGNOSIS — J302 Other seasonal allergic rhinitis: Secondary | ICD-10-CM

## 2022-05-24 DIAGNOSIS — Z23 Encounter for immunization: Secondary | ICD-10-CM

## 2022-05-24 DIAGNOSIS — Z0001 Encounter for general adult medical examination with abnormal findings: Secondary | ICD-10-CM

## 2022-05-24 DIAGNOSIS — R0681 Apnea, not elsewhere classified: Secondary | ICD-10-CM | POA: Diagnosis not present

## 2022-05-24 DIAGNOSIS — Z8551 Personal history of malignant neoplasm of bladder: Secondary | ICD-10-CM | POA: Diagnosis not present

## 2022-05-24 DIAGNOSIS — Z1211 Encounter for screening for malignant neoplasm of colon: Secondary | ICD-10-CM

## 2022-05-24 DIAGNOSIS — Z125 Encounter for screening for malignant neoplasm of prostate: Secondary | ICD-10-CM

## 2022-05-24 DIAGNOSIS — Z122 Encounter for screening for malignant neoplasm of respiratory organs: Secondary | ICD-10-CM

## 2022-05-24 NOTE — Progress Notes (Unsigned)
HPI:[name] is a 52 y.o. male with past medical history of bladder cancer who presents to establish care. For the details of today's visit, please refer to the assessment and plan.    Past Medical History:  Diagnosis Date   Cancer [facility])    bladder    Past Surgical History:  Procedure Laterality Date   TONSILLECTOMY     TUMOR REMOVAL      Family History  Problem Relation Age of Onset   Heart disease Mother    Heart disease Father    Cancer Father     Social History   Tobacco Use   Smoking status: Some Days    Packs/day: 1.00    Years: 36.00    Additional pack years: 0.00    Total pack years: 36.00    Types: Cigarettes, Cigars    Last attempt to quit: 2023    Years since quitting: 1.3   Smokeless tobacco: Never   Tobacco comments:    Quit cigarettes 2023.  Smokes several cigars/week now  Vaping Use   Vaping Use: Every day  Substance Use Topics   Alcohol use: Not Currently    Comment: alcoholic- none since age 89   Drug use: Not Currently    Types: Methamphetamines, Marijuana, Cocaine, PCP, MDMA (Ecstacy)    Comment: last use- april 2023      Physical Exam: Vitals:   05/24/22 1016  BP: 125/65  Pulse: 72  SpO2: 95%  Weight: 195 lb 1.9 oz (88.5 kg)  Height: 5' 8.5" (1.74 m)     Physical Exam Constitutional:      Appearance: He is well-developed and well-groomed.  HENT:     Mouth/Throat:     Mouth: Mucous membranes are moist.     Pharynx: Posterior oropharyngeal erythema present.     Tonsils: No tonsillar exudate.  Eyes:     General: No scleral icterus.    Conjunctiva/sclera: Conjunctivae normal.  Cardiovascular:     Rate and Rhythm: Normal rate and regular rhythm.     Heart sounds: No murmur heard.    No friction rub. No gallop.  Pulmonary:     Effort: Pulmonary effort is normal.     Breath sounds: No wheezing, rhonchi or rales.  Musculoskeletal:     Right lower leg: No edema.     Left lower leg: No edema.  Skin:     General: Skin is warm and dry.  Psychiatric:        Mood and Affect: Mood normal.        Behavior: Behavior normal.      Assessment & Plan:   History of bladder cancer Previously followed at [facility] in [facility] and [place].  He underwent Cystoscopic Resection twice. No hematuria or abdominal pain. Patient will sign for records today.  Allergic rhinitis Patient is experiencing congestion and sore throat. He is occasionally smoking a cigar and I recommended avoiding. Recommended nasal rinses and OTC antihistamine and fluticasone. Follow up if not improving.   Witnessed apneic spells Patient has apneic spells and loud snoring witnessed by his wife. Stop-bang score of 4, Intermediate risk for moderate to severe OSA. - Split night study ordered   Colon cancer screening Age 29. No family history of colon cancer.   Screening for lung cancer 36 pack year smoking history. History of bladder cancer.  Occasionally smokes cigar and is going to stop.  - LDCT Chest Lung Cancer Screening   Immunization due TDAP today  Encounter for general adult medical examination with abnormal findings Overweight. Obtain baseline labs including lipid panel, hemoglobin A1c, CBC, and CMP.  One time screening for HIV  Screening for prostate cancer Age 53. Will screen with PSA.     Milus Banister, MD

## 2022-05-24 NOTE — Patient Instructions (Addendum)
Please have pt sign medical record release forms.   Thank you, [name] for allowing [place] to provide your care today.   I have ordered the following labs for you:   Lab Orders         Lipid panel         VITAMIN D 25 Hydroxy (Vit-D Deficiency, Fractures)         CMP14+EGFR         Hemoglobin A1c         CBC with Differential/Platelet         HIV Antibody (routine testing w rflx)         PSA       Referrals ordered today:    Referral Orders         Ambulatory referral to Pulmonology      Sleep study   Reminders:  I will follow up with lab results. Quit smoking cigars , keep using your nasal rinses, and follow up if your throat pain doesn't improve please let us know.     Thurmon Fair, M.D.

## 2022-05-26 ENCOUNTER — Encounter: Payer: Self-pay | Admitting: Internal Medicine

## 2022-05-26 DIAGNOSIS — Z122 Encounter for screening for malignant neoplasm of respiratory organs: Secondary | ICD-10-CM | POA: Insufficient documentation

## 2022-05-26 DIAGNOSIS — Z1211 Encounter for screening for malignant neoplasm of colon: Secondary | ICD-10-CM | POA: Insufficient documentation

## 2022-05-26 DIAGNOSIS — Z23 Encounter for immunization: Secondary | ICD-10-CM | POA: Insufficient documentation

## 2022-05-26 DIAGNOSIS — R0681 Apnea, not elsewhere classified: Secondary | ICD-10-CM | POA: Insufficient documentation

## 2022-05-26 DIAGNOSIS — Z125 Encounter for screening for malignant neoplasm of prostate: Secondary | ICD-10-CM | POA: Insufficient documentation

## 2022-05-26 DIAGNOSIS — J309 Allergic rhinitis, unspecified: Secondary | ICD-10-CM | POA: Insufficient documentation

## 2022-05-26 DIAGNOSIS — Z0001 Encounter for general adult medical examination with abnormal findings: Secondary | ICD-10-CM | POA: Insufficient documentation

## 2022-05-26 NOTE — Assessment & Plan Note (Signed)
Previously followed at [facility] in [place] and [place].  He underwent Cystoscopic Resection twice. No hematuria or abdominal pain. Patient will sign for records today.

## 2022-05-26 NOTE — Assessment & Plan Note (Signed)
Age 52. No family history of colon cancer.

## 2022-05-26 NOTE — Assessment & Plan Note (Signed)
TDAP today 

## 2022-05-26 NOTE — Assessment & Plan Note (Signed)
Patient is experiencing congestion and sore throat. He is occasionally smoking a cigar and I recommended avoiding. Recommended nasal rinses and OTC antihistamine and fluticasone. Follow up if not improving.

## 2022-05-26 NOTE — Assessment & Plan Note (Addendum)
Patient has apneic spells and loud snoring witnessed by his wife. Stop-bang score of 4, Intermediate risk for moderate to severe OSA. - Split night study ordered

## 2022-05-26 NOTE — Assessment & Plan Note (Signed)
36 pack year smoking history. History of bladder cancer.  Occasionally smokes cigar and is going to stop.  - LDCT Chest Lung Cancer Screening

## 2022-05-26 NOTE — Addendum Note (Signed)
Addended by: Gardenia Phlegm on: 05/26/2022 08:04 AM   Modules accepted: Orders, Level of Service

## 2022-05-26 NOTE — Assessment & Plan Note (Signed)
Age 52. Will screen with PSA.

## 2022-05-26 NOTE — Assessment & Plan Note (Addendum)
Overweight. Obtain baseline labs including lipid panel, hemoglobin A1c, CBC, and CMP.  One time screening for HIV

## 2022-06-02 ENCOUNTER — Ambulatory Visit (INDEPENDENT_AMBULATORY_CARE_PROVIDER_SITE_OTHER): Payer: Medicaid Other | Admitting: Urology

## 2022-06-02 VITALS — BP 111/64 | HR 74

## 2022-06-02 DIAGNOSIS — N529 Male erectile dysfunction, unspecified: Secondary | ICD-10-CM | POA: Diagnosis not present

## 2022-06-02 DIAGNOSIS — Z8551 Personal history of malignant neoplasm of bladder: Secondary | ICD-10-CM

## 2022-06-02 MED ORDER — CIPROFLOXACIN HCL 500 MG PO TABS
500.0000 mg | ORAL_TABLET | Freq: Once | ORAL | Status: AC
  Administered 2022-06-02: 500 mg via ORAL

## 2022-06-02 NOTE — Progress Notes (Signed)
06/02/2022 2:49 PM   Brent Tran 05/23/1970 [id number]  Referring provider: Jacquelin Hawking, PA-C [address] [place],  [place] [address]  Hx of bladder Cancer   HPI: Brent Tran is a 51yo here for evaluation of bladder cancer. At age 20 he was diagnosed with superficial bladder cancer per patient. He has 2 recurrences. He never had BCG. He was going to have BCG but at the time he could not afford the therapy. No recent hematuria. IPSS 1 QOL 1 on no BPH therapy. He started smoking at age 12 and smoked 1-2 packs per day. He quit smoking 18 months ago. He has had gross hematuria in the past 2 years.    PMH: Past Medical History:  Diagnosis Date   Cancer [facility])    bladder    Surgical History: Past Surgical History:  Procedure Laterality Date   TONSILLECTOMY     TUMOR REMOVAL      Home Medications:  Allergies as of 06/02/2022       Reactions   Ultram [tramadol Hcl]    Mood swings   Penicillins Swelling, Rash        Medication List    as of Jun 02, 2022  2:49 PM   You have not been prescribed any medications.     Allergies:  Allergies  Allergen Reactions   Ultram [Tramadol Hcl]     Mood swings   Penicillins Swelling and Rash    Family History: Family History  Problem Relation Age of Onset   Heart disease Mother    Heart disease Father    Cancer Father     Social History:  reports that he has been smoking cigarettes and cigars. He has a 36.00 pack-year smoking history. He has never used smokeless tobacco. He reports that he does not currently use alcohol. 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  ROS: All other review of systems were reviewed and are negative except what is noted above in HPI  Physical Exam: BP 111/64   Pulse 74   Constitutional:  Alert and oriented, No acute distress. HEENT: [place] AT, moist mucus membranes.  Trachea  midline, no masses. Cardiovascular: No clubbing, cyanosis, or edema. Respiratory: Normal respiratory effort, no increased work of breathing. GI: Abdomen is soft, nontender, nondistended, no abdominal masses GU: No CVA tenderness.  Lymph: No cervical or inguinal lymphadenopathy. Skin: No rashes, bruises or suspicious lesions. Neurologic: Grossly intact, no focal deficits, moving all 4 extremities. Psychiatric: Normal mood and affect.  Laboratory Data: Lab Results  Component Value Date   WBC 11.4 (H) 04/29/2022   HGB 14.8 04/29/2022   HCT 43.2 04/29/2022   MCV 87.6 04/29/2022   PLT 400 04/29/2022    Lab Results  Component Value Date   CREATININE 0.90 04/29/2022    No results found for: "PSA"  No results found for: "TESTOSTERONE"  Lab Results  Component Value Date   HGBA1C 5.6 09/03/2021    Urinalysis No results found for: "COLORURINE", "APPEARANCEUR", "LABSPEC", "PHURINE", "GLUCOSEU", "HGBUR", "BILIRUBINUR", "KETONESUR", "PROTEINUR", "UROBILINOGEN", "NITRITE", "LEUKOCYTESUR"  No results found for: "LABMICR", "WBCUA", "RBCUA", "LABEPIT", "MUCUS", "BACTERIA"  Pertinent Imaging: No results found for this or any previous visit.  No results found for this or any previous visit.  No results found for this or any previous visit.  No results found for this or any previous visit.  No results found for this or any previous  visit.  No valid procedures specified. No results found for this or any previous visit.  No results found for this or any previous visit.   Assessment & Plan:    1. History of bladder cancer -followup 1 year for cystoscopy - Urinalysis, Routine w reflex microscopic  2. Erectile dysfunction, unspecified erectile dysfunction type -patient defers therapy at this time   No follow-ups on file.  Wilkie Aye, MD  [facility] [place]       Cystoscopy Procedure Note  Patient identification was confirmed, informed consent was  obtained, and patient was prepped using Betadine solution.  Lidocaine jelly was administered per urethral meatus.     Pre-Procedure: - Inspection reveals a normal caliber ureteral meatus.  Procedure: The flexible cystoscope was introduced without difficulty - No urethral strictures/lesions are present. - Enlarged prostate  - Normal bladder neck - Bilateral ureteral orifices identified - Bladder mucosa  reveals no ulcers, tumors, or lesions - No bladder stones - No trabeculation     Post-Procedure: - Patient tolerated the procedure well  Assessment/ Plan: Folllwup 1 year for cystoscopy   Wilkie Aye, MD

## 2022-06-03 LAB — URINALYSIS, ROUTINE W REFLEX MICROSCOPIC
Bilirubin, UA: NEGATIVE
Glucose, UA: NEGATIVE
Ketones, UA: NEGATIVE
Leukocytes,UA: NEGATIVE
Nitrite, UA: NEGATIVE
Protein,UA: NEGATIVE
RBC, UA: NEGATIVE
Specific Gravity, UA: 1.03 (ref 1.005–1.030)
Urobilinogen, Ur: 0.2 mg/dL (ref 0.2–1.0)
pH, UA: 5.5 (ref 5.0–7.5)

## 2022-06-03 LAB — CYTOLOGY, URINE

## 2022-06-08 ENCOUNTER — Encounter: Payer: Self-pay | Admitting: Urology

## 2022-06-08 NOTE — Patient Instructions (Signed)

## 2022-06-10 ENCOUNTER — Other Ambulatory Visit: Payer: Self-pay

## 2022-06-10 ENCOUNTER — Encounter (HOSPITAL_COMMUNITY): Payer: Self-pay | Admitting: Emergency Medicine

## 2022-06-10 ENCOUNTER — Emergency Department (HOSPITAL_COMMUNITY): Payer: Medicaid Other

## 2022-06-10 ENCOUNTER — Emergency Department (HOSPITAL_COMMUNITY)
Admission: EM | Admit: 2022-06-10 | Discharge: 2022-06-10 | Disposition: A | Discharge status: Home / Self Care | Payer: Medicaid Other | Attending: Emergency Medicine | Admitting: Emergency Medicine

## 2022-06-10 DIAGNOSIS — R739 Hyperglycemia, unspecified: Secondary | ICD-10-CM

## 2022-06-10 DIAGNOSIS — F17200 Nicotine dependence, unspecified, uncomplicated: Secondary | ICD-10-CM | POA: Diagnosis not present

## 2022-06-10 DIAGNOSIS — Z8551 Personal history of malignant neoplasm of bladder: Secondary | ICD-10-CM | POA: Diagnosis not present

## 2022-06-10 DIAGNOSIS — R0789 Other chest pain: Secondary | ICD-10-CM | POA: Insufficient documentation

## 2022-06-10 DIAGNOSIS — E1165 Type 2 diabetes mellitus with hyperglycemia: Secondary | ICD-10-CM | POA: Insufficient documentation

## 2022-06-10 LAB — BASIC METABOLIC PANEL
Anion gap: 8 (ref 5–15)
BUN: 13 mg/dL (ref 6–20)
CO2: 25 mmol/L (ref 22–32)
Calcium: 8.9 mg/dL (ref 8.9–10.3)
Chloride: 102 mmol/L (ref 98–111)
Creatinine, Ser: 0.75 mg/dL (ref 0.61–1.24)
GFR, Estimated: >60 mL/min (ref >60)
Glucose, Bld: 113 mg/dL — ABNORMAL HIGH (ref 70–99)
Potassium: 3.8 mmol/L (ref 3.5–5.1)
Sodium: 135 mmol/L (ref 135–145)

## 2022-06-10 LAB — TROPONIN I (HIGH SENSITIVITY)
Troponin I (High Sensitivity): <2 ng/L (ref <18)
Troponin I (High Sensitivity): <2 ng/L (ref <18)

## 2022-06-10 LAB — CBC
HCT: 43.4 % (ref 39.0–52.0)
Hemoglobin: 14.9 g/dL (ref 13.0–17.0)
MCH: 30.3 pg (ref 26.0–34.0)
MCHC: 34.3 g/dL (ref 30.0–36.0)
MCV: 88.2 fL (ref 80.0–100.0)
Platelets: 374 10*3/uL (ref 150–400)
RBC: 4.92 MIL/uL (ref 4.22–5.81)
RDW: 12.9 % (ref 11.5–15.5)
WBC: 10.3 10*3/uL (ref 4.0–10.5)
nRBC: 0 % (ref 0.0–0.2)

## 2022-06-10 LAB — CBG MONITORING, ED: Glucose-Capillary: 126 mg/dL — ABNORMAL HIGH (ref 70–99)

## 2022-06-10 NOTE — ED Triage Notes (Addendum)
Pt via POV c/o left chest tightness with radiation to left arm and CBG 250 at home, no dx diabetes. CP currently rated 1/10 and no other symptoms. No prior cardiac history. CBG 126 in triage

## 2022-06-10 NOTE — ED Provider Notes (Signed)
[place] EMERGENCY DEPARTMENT AT [facility] Provider Note   CSN: [id number] Arrival date & time: 06/10/22  1338     History Chief Complaint  Patient presents with   Chest Pain   Hyperglycemia    Brent Tran is a 52 y.o. male.  Patient with past history significant for bladder cancer presents emergency department complaints of chest pain and hyperglycemia.  He reports that his wife is a type II diabetic and was checking blood sugars the patient decided to check his own blood sugar.  He reports that his blood sugar on initial testing was around 230.  On recheck about 10 minutes later he reports sugar went up to 250.  Patient is not diabetic or prediabetic.  Not currently on any antiglycemic medications.  Last A1c was 09/03/2021 which was 5.6.  Also reporting some left-sided chest pain but does report a history of chronic left shoulder problems and pain.  Denies the chest pain is radiating into the arm but feels more like a aching muscle.  Pain with movement of the left shoulder.   Chest Pain Hyperglycemia Associated symptoms: chest pain        Home Medications Prior to Admission medications   Not on File      Allergies    Ultram [tramadol hcl] and Penicillins    Review of Systems   Review of Systems  Cardiovascular:  Positive for chest pain.  All other systems reviewed and are negative.   Physical Exam Updated Vital Signs BP 102/68   Pulse (!) 59   Temp 98 F (36.7 C) (Oral)   Resp 14   Ht 5' 8.5" (1.74 m)   Wt 89.8 kg   SpO2 99%   BMI 29.67 kg/m  Physical Exam Vitals and nursing note reviewed.  Constitutional:      General: He is not in acute distress.    Appearance: He is well-developed.  HENT:     Head: Normocephalic and atraumatic.  Eyes:     Conjunctiva/sclera: Conjunctivae normal.  Cardiovascular:     Rate and Rhythm: Normal rate and regular rhythm.     Heart sounds: No murmur heard. Pulmonary:     Effort: Pulmonary effort is normal.  No respiratory distress.     Breath sounds: Normal breath sounds.  Abdominal:     Palpations: Abdomen is soft.     Tenderness: There is no abdominal tenderness.  Musculoskeletal:        General: No swelling.     Cervical back: Neck supple.  Skin:    General: Skin is warm and dry.     Capillary Refill: Capillary refill takes less than 2 seconds.  Neurological:     Mental Status: He is alert.  Psychiatric:        Mood and Affect: Mood normal.     ED Results / Procedures / Treatments   Labs (all labs ordered are listed, but only abnormal results are displayed) Labs Reviewed  BASIC METABOLIC PANEL - Abnormal; Notable for the following components:      Result Value   Glucose, Bld 113 (*)    All other components within normal limits  CBG MONITORING, ED - Abnormal; Notable for the following components:   Glucose-Capillary 126 (*)    All other components within normal limits  CBC  TROPONIN I (HIGH SENSITIVITY)  TROPONIN I (HIGH SENSITIVITY)    EKG EKG Interpretation  Date/Time:  Thursday Jun 10 2022 13:53:42 EDT Ventricular Rate:  64 PR Interval:  140 QRS Duration: 76 QT Interval:  390 QTC Calculation: 402 R Axis:   4 Text Interpretation: Normal sinus rhythm Nonspecific T wave abnormality Abnormal ECG No previous ECGs available Confirmed by Benjiman Core [phone]) on 06/11/2022 2:37:35 PM  Radiology DG Chest 2 View  Result Date: 06/10/2022 CLINICAL DATA:  Chest pain.  Smoker. EXAM: CHEST - 2 VIEW COMPARISON:  04/29/2022 FINDINGS: The heart size and mediastinal contours are within normal limits. Both lungs are clear. The visualized skeletal structures are unremarkable. IMPRESSION: No active cardiopulmonary disease. Electronically Signed   By: Signa Kell M.D.   On: 06/10/2022 14:18    Procedures Procedures   Medications Ordered in ED Medications - No data to display  ED Course/ Medical Decision Making/ A&P                           Medical Decision  Making Amount and/or Complexity of Data Reviewed Labs: ordered. Radiology: ordered.   This patient presents to the ED for concern of chest pain, hyperglycemia.  Differential diagnosis includes ACS, uncontrolled diabetes, sepsis, URI   Lab Tests:  I Ordered, and personally interpreted labs.  The pertinent results include: CBC unremarkable, BMP unremarkable, glucose slightly elevated at 126, troponin negative   Imaging Studies ordered:  I ordered imaging studies including chest x-ray I independently visualized and interpreted imaging which showed no acute cardiopulmonary process I agree with the radiologist interpretation   Problem List / ED Course:  Patient presents emergency department complaints of left-sided chest tightness and radiation to left arm.  Patient's blood sugar was elevated at home at 250 but no prior history of diabetes.  Reports that chest pain is now resolved.  He reports he has significant family history of type 2 diabetes so he checks his blood sugar somewhat regularly with wife's monitor.  Here in the emergency department, patient blood sugar is controlled and not acutely concerning.  Patient not currently experiencing any polyuria, polydipsia, polyphagia. Lab workup initiated without any acute findings noted.  Extremity was negative for any abnormalities in the chest.  EKG was at baseline showing normal sinus rhythm without any acute or obvious ST changes.  Given reassuring workup, I believe the patient is safe for discharge home.  Advised patient on blood sugar control and dietary modifications to make for healthier lifestyle.  Patient is stable for discharge and is agreeable with treatment plan verbalized understanding all return precautions.  Final Clinical Impression(s) / ED Diagnoses Final diagnoses:  Chest wall pain  Hyperglycemia    Rx / DC Orders ED Discharge Orders     None         Smitty Knudsen, PA-C 06/11/22 2200    Bethann Berkshire,  MD 06/20/22 1333

## 2022-06-10 NOTE — Discharge Instructions (Addendum)
You were seen in the emergency department for chest pain and elevated blood sugar. Thankfully your labs were negative today for any acute concerns. I would advise that you follow up with your primary care provider for further evaluation or return to the ER as needed if symptoms are worsening.

## 2022-06-11 ENCOUNTER — Telehealth: Payer: Self-pay

## 2022-06-11 NOTE — Transitions of Care (Post Inpatient/ED Visit) (Signed)
   06/11/2022  Name: Brent Tran MRN: [id number] DOB: [birth year]  Today's TOC FU Call Status: Today's TOC FU Call Status:: Successful TOC FU Call Competed TOC FU Call Complete Date: 06/11/22  Transition Care Management Follow-up Telephone Call Date of Discharge: 06/10/22 [facility] Type of Discharge: Emergency Department Reason for ED Visit: Other: (chest pain and hyperglycemia) How have you been since you were released from the hospital?: Better Any questions or concerns?: No  Items Reviewed: Did you receive and understand the discharge instructions provided?: Yes Medications obtained,verified, and reconciled?: Yes (Medications Reviewed) Any new allergies since your discharge?: No Dietary orders reviewed?: NA Do you have support at home?: Yes People in Home: spouse  Medications Reviewed Today: Medications Reviewed Today     Reviewed by Barb Merino, LPN (Licensed Practical Nurse) on 06/11/22 at 1539  Med List Status: <None>   Medication Order Taking? Sig Documenting Provider Last Dose Status Informant           No Medications to Display                            Home Care and Equipment/Supplies: Were Home Health Services Ordered?: NA Any new equipment or medical supplies ordered?: NA  Functional Questionnaire: Do you need assistance with bathing/showering or dressing?: No Do you need assistance with meal preparation?: No Do you need assistance with eating?: No Do you have difficulty maintaining continence: No Do you need assistance with getting out of bed/getting out of a chair/moving?: No Do you have difficulty managing or taking your medications?: No  Follow up appointments reviewed: PCP Follow-up appointment confirmed?: NA (patient declined) Specialist Hospital Follow-up appointment confirmed?: NA Do you need transportation to your follow-up appointment?: No Do you understand care options if your condition(s) worsen?:  Yes-patient verbalized understanding    SIGNATURE Elisha Ponder LPN [facility] AWV Team Direct dial:  [phone]

## 2022-08-24 ENCOUNTER — Ambulatory Visit: Payer: Medicaid Other | Admitting: Internal Medicine

## 2022-08-26 ENCOUNTER — Encounter: Payer: Self-pay | Admitting: Internal Medicine

## 2022-09-01 ENCOUNTER — Ambulatory Visit: Payer: Self-pay | Admitting: Physician Assistant

## 2023-06-06 ENCOUNTER — Other Ambulatory Visit: Payer: Medicaid Other | Admitting: Urology

## 2023-06-08 ENCOUNTER — Other Ambulatory Visit: Payer: Medicaid Other | Admitting: Urology

## 2023-07-20 ENCOUNTER — Other Ambulatory Visit: Payer: Self-pay | Admitting: Urology

## 2023-11-07 ENCOUNTER — Ambulatory Visit (HOSPITAL_COMMUNITY)
Admission: EM | Admit: 2023-11-07 | Discharge: 2023-11-08 | Disposition: A | Discharge status: Home / Self Care | Attending: Psychiatry | Admitting: Psychiatry

## 2023-11-07 DIAGNOSIS — Z9151 Personal history of suicidal behavior: Secondary | ICD-10-CM | POA: Insufficient documentation

## 2023-11-07 DIAGNOSIS — Z046 Encounter for general psychiatric examination, requested by authority: Secondary | ICD-10-CM

## 2023-11-07 DIAGNOSIS — F333 Major depressive disorder, recurrent, severe with psychotic symptoms: Secondary | ICD-10-CM | POA: Diagnosis not present

## 2023-11-07 DIAGNOSIS — F1910 Other psychoactive substance abuse, uncomplicated: Secondary | ICD-10-CM

## 2023-11-07 DIAGNOSIS — R443 Hallucinations, unspecified: Secondary | ICD-10-CM

## 2023-11-07 DIAGNOSIS — F1410 Cocaine abuse, uncomplicated: Secondary | ICD-10-CM | POA: Diagnosis not present

## 2023-11-07 DIAGNOSIS — F69 Unspecified disorder of adult personality and behavior: Secondary | ICD-10-CM

## 2023-11-07 DIAGNOSIS — F919 Conduct disorder, unspecified: Secondary | ICD-10-CM | POA: Insufficient documentation

## 2023-11-07 DIAGNOSIS — F1510 Other stimulant abuse, uncomplicated: Secondary | ICD-10-CM | POA: Insufficient documentation

## 2023-11-07 LAB — POCT URINE DRUG SCREEN - MANUAL ENTRY (I-SCREEN)
POC Amphetamine UR: POSITIVE — AB
POC Buprenorphine (BUP): NOT DETECTED
POC Cocaine UR: NOT DETECTED
POC Marijuana UR: NOT DETECTED
POC Methadone UR: NOT DETECTED
POC Methamphetamine UR: POSITIVE — AB
POC Morphine: NOT DETECTED
POC Oxazepam (BZO): NOT DETECTED
POC Oxycodone UR: NOT DETECTED
POC Secobarbital (BAR): NOT DETECTED

## 2023-11-07 MED ORDER — ALUM & MAG HYDROXIDE-SIMETH 200-200-20 MG/5ML PO SUSP: 30.0000 mL | ORAL | Status: DC | PRN

## 2023-11-07 MED ORDER — LORAZEPAM 2 MG/ML IJ SOLN: 2.0000 mg | Freq: Three times a day (TID) | INTRAMUSCULAR | Status: DC | PRN

## 2023-11-07 MED ORDER — ACETAMINOPHEN 325 MG PO TABS: 650.0000 mg | ORAL_TABLET | Freq: Four times a day (QID) | ORAL | Status: DC | PRN

## 2023-11-07 MED ORDER — DIPHENHYDRAMINE HCL 50 MG/ML IJ SOLN: 50.0000 mg | Freq: Three times a day (TID) | INTRAMUSCULAR | Status: DC | PRN

## 2023-11-07 MED ORDER — HALOPERIDOL LACTATE 5 MG/ML IJ SOLN: 5.0000 mg | Freq: Three times a day (TID) | INTRAMUSCULAR | Status: DC | PRN

## 2023-11-07 MED ORDER — HALOPERIDOL LACTATE 5 MG/ML IJ SOLN: 10.0000 mg | Freq: Three times a day (TID) | INTRAMUSCULAR | Status: DC | PRN

## 2023-11-07 MED ORDER — MAGNESIUM HYDROXIDE 400 MG/5ML PO SUSP: 30.0000 mL | Freq: Every day | ORAL | Status: DC | PRN

## 2023-11-07 MED ORDER — DIPHENHYDRAMINE HCL 50 MG PO CAPS: 50.0000 mg | ORAL_CAPSULE | Freq: Three times a day (TID) | ORAL | Status: DC | PRN

## 2023-11-07 MED ORDER — HALOPERIDOL 5 MG PO TABS: 5.0000 mg | ORAL_TABLET | Freq: Three times a day (TID) | ORAL | Status: DC | PRN

## 2023-11-07 NOTE — Progress Notes (Signed)
   11/07/23 2049  [facility] Triage Screening (Walk-ins at [facility] only)  How Did You Hear About Us ? Family/Friend  What Is the Reason for Your Visit/Call Today? Pt arrived at [facility] by way of law enforcement.  He said that his wife had him IVC'ed  He said that she thinks that he needs some help.  He does not know what he needs help with   Pt says that this is the fourth time she has had him IVC'ed in his life.  He said that he wants to get a divorce from her.  He said that she tries to control the situation.  He said that she has sold some of his belongings  while he was away in [place] .  He had been there for 2 weeks and jut got back a few days ago to find she had sold some of his things (3 vehicles and some of his personal belongings).  He denies any SI or HI.  No previous suicide attempt.  No AV hallucinations.  Pt deneis having access to guns.  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  Last use of meth is 30 hours ago.  He usually smokes it.  Patient has no outpatient mental health care.  He says that when things don't go her way she tries to get him IVC'ed.  How Long Has This Been Causing You Problems? <Week  Have You Recently Had Any Thoughts About Hurting Yourself? No  Are You Planning to Commit Suicide/Harm Yourself At This time? No  Have you Recently Had Thoughts About Hurting Someone [place]? No  Are You Planning To Harm Someone At This Time? No  Physical Abuse Denies  Verbal Abuse Denies  Sexual Abuse Denies  Exploitation of patient/patient's resources Denies  Self-Neglect Denies  Possible abuse reported to:  (None)  Are you currently experiencing any auditory, visual or other hallucinations? No  Have You Used Any Alcohol or Drugs in the Past 24 Hours? No  Do you have any current medical co-morbidities that require immediate attention? No  Clinician description of patient physical appearance/behavior: Pt is casually dressed and has good eye contact.  Pt is oriented x4.  He speaks  clearly and is not rsponding to internal stimuli.  What Do You Feel Would Help You the Most Today? Treatment for Depression or other mood problem  If access to [facility] was not available, would you have sought care in the Emergency Department? No  Determination of Need Urgent (48 hours)  Options For Referral [facility] (Pt is on IVC.)  Determination of Need filed? Yes
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
## 2023-11-08 LAB — CBC WITH DIFFERENTIAL/PLATELET
Abs Immature Granulocytes: 0.03 K/uL (ref 0.00–0.07)
Basophils Absolute: 0.1 K/uL (ref 0.0–0.1)
Basophils Relative: 1 %
Eosinophils Absolute: 0.6 K/uL — ABNORMAL HIGH (ref 0.0–0.5)
Eosinophils Relative: 5 %
HCT: 43.2 % (ref 39.0–52.0)
Hemoglobin: 14.9 g/dL (ref 13.0–17.0)
Immature Granulocytes: 0 %
Lymphocytes Relative: 33 %
Lymphs Abs: 3.8 K/uL (ref 0.7–4.0)
MCH: 30.6 pg (ref 26.0–34.0)
MCHC: 34.5 g/dL (ref 30.0–36.0)
MCV: 88.7 fL (ref 80.0–100.0)
Monocytes Absolute: 0.7 K/uL (ref 0.1–1.0)
Monocytes Relative: 6 %
Neutro Abs: 6.4 K/uL (ref 1.7–7.7)
Neutrophils Relative %: 55 %
Platelets: 438 K/uL — ABNORMAL HIGH (ref 150–400)
RBC: 4.87 MIL/uL (ref 4.22–5.81)
RDW: 12.6 % (ref 11.5–15.5)
WBC: 11.6 K/uL — ABNORMAL HIGH (ref 4.0–10.5)
nRBC: 0 % (ref 0.0–0.2)

## 2023-11-08 LAB — COMPREHENSIVE METABOLIC PANEL WITH GFR
ALT: 17 U/L (ref 0–44)
AST: 21 U/L (ref 15–41)
Albumin: 4.4 g/dL (ref 3.5–5.0)
Alkaline Phosphatase: 68 U/L (ref 38–126)
Anion gap: 10 (ref 5–15)
BUN: 11 mg/dL (ref 6–20)
CO2: 25 mmol/L (ref 22–32)
Calcium: 9.6 mg/dL (ref 8.9–10.3)
Chloride: 102 mmol/L (ref 98–111)
Creatinine, Ser: 0.84 mg/dL (ref 0.61–1.24)
GFR, Estimated: >60 mL/min (ref >60)
Glucose, Bld: 76 mg/dL (ref 70–99)
Potassium: 4.5 mmol/L (ref 3.5–5.1)
Sodium: 137 mmol/L (ref 135–145)
Total Bilirubin: 0.7 mg/dL (ref 0.0–1.2)
Total Protein: 6.9 g/dL (ref 6.5–8.1)

## 2023-11-08 LAB — ETHANOL: Alcohol, Ethyl (B): <15 mg/dL (ref <15)

## 2023-11-08 LAB — TSH: TSH: 1.186 u[IU]/mL (ref 0.350–4.500)
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
## 2023-11-08 NOTE — ED Notes (Signed)
 Patient has been provided his belongings and after visit summary. Patient has been escorted to waiting room to await his wife, who will be picking patient up

## 2023-11-08 NOTE — Discharge Instructions (Addendum)
Patient is instructed prior to discharge to:  Take all medications as prescribed by his/her mental healthcare provider. Report any adverse effects and or reactions from the medicines to his/her outpatient provider promptly. Keep all scheduled appointments, to ensure that you are getting refills on time and to avoid any interruption in your medication.  If you are unable to keep an appointment call to reschedule.  Be sure to follow-up with resources and follow-up appointments provided.  Patient has been instructed & cautioned: To not engage in alcohol and or illegal drug use while on prescription medicines. In the event of worsening symptoms, patient is instructed to call the crisis hotline, 911 and or go to the nearest ED for appropriate evaluation and treatment of symptoms. To follow-up with his/her primary care provider for your other medical issues, concerns and or health care needs.  Information: -National Suicide Prevention Lifeline 1-800-SUICIDE or 1-800-273-8255.  -988 offers 24/7 access to trained crisis counselors who can help people experiencing mental health-related distress. People can call or text 988 or chat 988lifeline.org for themselves or if they are worried about a loved one who may need crisis support.      Substance Abuse Treatment Resources listed Below:  [facility] - Admissions are currently completed Monday through Friday at 8am; both appointments and walk-ins are accepted.  Any individual that is a [place] resident may present for a substance abuse screening and assessment for admission.  A person may be referred by numerous sources or self-refer.   Potential clients will be screened for medical necessity and appropriateness for the program.  Clients must meet criteria for high-intensity residential treatment services.  If clinically appropriate, a client will continue with the comprehensive clinical assessment and intake process, as well  as enrollment in the [facility].  Address: [address] [place], [place] [address] Admin Hours: Mon-Fri 8AM to 5PM Center Hours: 24/7 Phone: [phone] Fax: [phone]  [facility] Address: [address], , [place] [address] [facility] ([facility]) Hours: 24/7 Phone: [phone] Fax: [phone]  [facility]): (offers outpatient therapy and intensive outpatient substance abuse therapy).  [address], [place], [place] [address] Phone: [phone]  [facility]: Offers FREE recovery skills classes, support groups, 1:1 Peer Support, and Compeer Classes. [address], [place],  [address] Phone: [phone] (Call to complete intake).   [facility] [address] [place],  [address] Phone: [phone] ext [phone] [facility] provides food, shelter and other programs and services to the homeless men of [place] through our men's program.  By offering safe shelter, three meals a day, clean clothing, Biblical counseling, financial planning, vocational training, GED/education and employment assistance, we've helped mend the shattered lives of many homeless men since opening in 1974.  We have approximately 267 beds available, with a max of 312 beds including mats for emergency situations and currently house an average of 270 men a night.  Prospective Client Check-In Information Photo ID Required (State/ Out of State/ DOC) - if photo ID is not available, clients are required to have a printout of a police/sheriff's criminal history report. Help out with chores around the Mission. No sex offender of any type (pending, charged, registered and/or any other sex related offenses) will be permitted to check in. Must be willing to abide by all rules, regulations, and policies established by the [facility]. The  following   will be provided - shelter, food, clothing, and biblical counseling. If you or someone you know is in need of assistance at our men's shelter in [place], [place], please call [phone] ext. [phone].  [facility]-will provide timely access to mental health services for children and adolescents (4-17) and adults presenting in a mental health crisis. The program is designed for those who need urgent Behavioral Health or Substance Use treatment and are not experiencing a medical crisis that would typically require an emergency room visit.    [address] [place], [place] [address] Phone: [phone] [email or web address]  [facility]: Phone#: [phone]  The [facility] SA Intensive Outpatient Program (SAIOP) means structured individual and group addiction activities and services that are provided at an outpatient program designed to assist adult and adolescent consumers to begin recovery and learn skills for recovery maintenance. The [facility] SAIOP program is offered at least 3 hours a day, 3 days a week.SAIOP services shall include a structured program consisting of, but not limited to, the following services: Individual counseling and support; Group counseling and support; Family counseling, training or support; Biochemical assays to identify recent drug use (e.g., urine drug screens); Strategies for relapse prevention to include community and social support systems in treatment; Life skills; Crisis contingency planning; Disease Management; and Treatment support activities that have been adapted or specifically designed for persons with physical disabilities, or persons with co-occurring disorders of mental illness and substance abuse/dependence or mental retardation/developmental disability and substance abuse/dependence. Phone: [phone]  Address:   [facility] [place] will also offer the following outpatient  services: (Monday through Friday 8am-5pm)   Partial Hospitalization Program (PHP) Substance Abuse Intensive Outpatient Program (SA-IOP) Group Therapy Medication Management Peer Living Room We also provide (24/7):  Assessments: Our mental health clinician and providers will conduct a focused mental health evaluation, assessing for immediate safety concerns and further mental health needs. Referral: Our team will provide resources and help connect to community based mental health treatment, when indicated, including psychotherapy, psychiatry, and other specialized behavioral health or substance use disorder services (for those not already in treatment). Transitional Care: Our team providers in person bridging and/or telephonic follow-up during the patient's transition to outpatient services.  [facility] 24-Hour Call Center: 1-800-256-2452 Behavioral Health Crisis Line: [phone]  
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
## 2023-11-08 NOTE — ED Notes (Signed)
 Pt is currently sleeping, no distress noted, environmental check complete, will continue to monitor patient for safety.

## 2023-11-08 NOTE — ED Notes (Signed)
 Patient has been advised that his IVC has been rescinded and that the plan is for his wife to pick him up. Patient has been provided his wifes number and advised to reach out to her prior to discharge.

## 2024-02-13 ENCOUNTER — Emergency Department (HOSPITAL_COMMUNITY): Payer: Self-pay

## 2024-02-13 ENCOUNTER — Emergency Department (HOSPITAL_COMMUNITY)
Admission: EM | Admit: 2024-02-13 | Discharge: 2024-02-13 | Disposition: A | Discharge status: Home / Self Care | Payer: Self-pay | Attending: Emergency Medicine | Admitting: Emergency Medicine

## 2024-02-13 ENCOUNTER — Other Ambulatory Visit: Payer: Self-pay

## 2024-02-13 ENCOUNTER — Encounter (HOSPITAL_COMMUNITY): Payer: Self-pay | Admitting: *Deleted

## 2024-02-13 DIAGNOSIS — R0789 Other chest pain: Secondary | ICD-10-CM | POA: Insufficient documentation

## 2024-02-13 DIAGNOSIS — R079 Chest pain, unspecified: Secondary | ICD-10-CM

## 2024-02-13 DIAGNOSIS — K0889 Other specified disorders of teeth and supporting structures: Secondary | ICD-10-CM | POA: Insufficient documentation

## 2024-02-13 DIAGNOSIS — Z8551 Personal history of malignant neoplasm of bladder: Secondary | ICD-10-CM | POA: Insufficient documentation

## 2024-02-13 LAB — BASIC METABOLIC PANEL WITH GFR
Anion gap: 13 (ref 5–15)
BUN: 11 mg/dL (ref 6–20)
CO2: 24 mmol/L (ref 22–32)
Calcium: 9.3 mg/dL (ref 8.9–10.3)
Chloride: 102 mmol/L (ref 98–111)
Creatinine, Ser: 0.96 mg/dL (ref 0.61–1.24)
GFR, Estimated: >60 mL/min
Glucose, Bld: 119 mg/dL — ABNORMAL HIGH (ref 70–99)
Potassium: 4.1 mmol/L (ref 3.5–5.1)
Sodium: 140 mmol/L (ref 135–145)

## 2024-02-13 LAB — CBC
HCT: 41.7 % (ref 39.0–52.0)
Hemoglobin: 14.5 g/dL (ref 13.0–17.0)
MCH: 30.4 pg (ref 26.0–34.0)
MCHC: 34.8 g/dL (ref 30.0–36.0)
MCV: 87.4 fL (ref 80.0–100.0)
Platelets: 414 10*3/uL — ABNORMAL HIGH (ref 150–400)
RBC: 4.77 MIL/uL (ref 4.22–5.81)
RDW: 12.8 % (ref 11.5–15.5)
WBC: 15.1 10*3/uL — ABNORMAL HIGH (ref 4.0–10.5)
nRBC: 0 % (ref 0.0–0.2)

## 2024-02-13 LAB — TROPONIN T, HIGH SENSITIVITY: Troponin T High Sensitivity: 7 ng/L (ref 0–19)

## 2024-02-13 MED ORDER — FAMOTIDINE 20 MG PO TABS
20.0000 mg | ORAL_TABLET | Freq: Once | ORAL | Status: AC
  Administered 2024-02-13: 20 mg via ORAL
  Filled 2024-02-13: qty 1

## 2024-02-13 MED ORDER — ACETAMINOPHEN 500 MG PO TABS
1000.0000 mg | ORAL_TABLET | Freq: Once | ORAL | Status: AC
  Administered 2024-02-13: 1000 mg via ORAL
  Filled 2024-02-13: qty 2

## 2024-02-13 MED ORDER — CLINDAMYCIN HCL 150 MG PO CAPS
450.0000 mg | ORAL_CAPSULE | Freq: Once | ORAL | Status: AC
  Administered 2024-02-13: 450 mg via ORAL
  Filled 2024-02-13: qty 3

## 2024-02-13 MED ORDER — CLINDAMYCIN HCL 150 MG PO CAPS: 450.0000 mg | ORAL_CAPSULE | Freq: Three times a day (TID) | ORAL | 0 refills | Status: DC

## 2024-02-13 NOTE — Discharge Instructions (Addendum)
 Evaluation for your chest pain was overall reassuring.  As we discussed I we will start you on antibiotics for what could possibly be an early developing dental infection.  Sent clindamycin  for treatment to your pharmacy.  Please follow-up with a dentist.  If you develop a fever, have trouble breathing, and start to drool or have trouble swallowing or any other concerning symptom please return to ED for further evaluation.  Also if your chest pain returns you develop shortness of breath please return as well.

## 2024-02-13 NOTE — ED Triage Notes (Signed)
 Pt states yesterday he took a pill for ED that he obtained from [place] and states immediately after taking the pill he had some chest pain and vomited  Pt states he has some cavities and is now having pain and swelling to right upper jaw

## 2024-02-13 NOTE — ED Provider Notes (Signed)
 " [place] [facility] DE[facility]MENT AT [facility] Provider Note   CSN: [id number] Arrival date & time: 02/13/24  9074     Patient presents with: Chest Pain and Dental Pain  HPI Brent Tran is a 54 y.o. male presenting for chest pain and dental pain.  He states he took that boot leg Viagra from [place] yesterday.  He states that shortly after he started to have left-sided chest pain and threw up once.  He states after vomiting his chest pain was relieved.  Does not have chest pain or shortness of breath at this time.  Also reports some pain in the right upper teeth that is intermittent but seems to be worse in the last day after he vomited.  States he is now eligible for insurance and is planning to schedule an appointment with a dentist soon.  Denies fever, trouble swallowing or breathing.  Past Medical History:  Diagnosis Date   Cancer [facility])    bladder       Chest Pain Dental Pain      Prior to Admission medications  Not on File    Allergies: Ultram [tramadol hcl] and Penicillins    Review of Systems  Cardiovascular:  Positive for chest pain.    Updated Vital Signs BP 138/84   Pulse 81   Temp 98.1 F (36.7 C) (Oral)   Resp 18   Ht 5' 10 (1.778 m)   Wt 86.2 kg   SpO2 98%   BMI 27.26 kg/m   Physical Exam Vitals and nursing note reviewed.  HENT:     Head: Normocephalic and atraumatic.     Mouth/Throat:     Mouth: Mucous membranes are moist.     Tongue: No lesions. Tongue does not deviate from midline.     Palate: No mass and lesions.     Pharynx: Oropharynx is clear. Uvula midline.     Tonsils: No tonsillar exudate or tonsillar abscesses.      Comments: Generally poor dentition. Eyes:     General:        Right eye: No discharge.        Left eye: No discharge.     Conjunctiva/sclera: Conjunctivae normal.  Cardiovascular:     Rate and Rhythm: Normal rate and regular rhythm.     Pulses: Normal pulses.     Heart sounds: Normal heart sounds.   Pulmonary:     Effort: Pulmonary effort is normal.     Breath sounds: Normal breath sounds.  Abdominal:     General: Abdomen is flat. There is no distension.     Palpations: Abdomen is soft.     Tenderness: There is no abdominal tenderness.  Skin:    General: Skin is warm and dry.  Neurological:     General: No focal deficit present.  Psychiatric:        Mood and Affect: Mood normal.     (all labs ordered are listed, but only abnormal results are displayed) Labs Reviewed  BASIC METABOLIC PANEL WITH GFR - Abnormal; Notable for the following components:      Result Value   Glucose, Bld 119 (*)    All other components within normal limits  CBC - Abnormal; Notable for the following components:   WBC 15.1 (*)    Platelets 414 (*)    All other components within normal limits  TROPONIN T, HIGH SENSITIVITY    EKG: None  Radiology: DG Chest 2 View Result Date: 02/13/2024 EXAM: 2 VIEW(S)  XRAY OF THE CHEST 02/13/2024 10:26:00 AM COMPARISON: Chest radiographs 06/10/2022 and earlier. CLINICAL HISTORY: 54 year old male with chest pain and vomiting. FINDINGS: LUNGS AND PLEURA: No focal pulmonary opacity. No pleural effusion. No pneumothorax. HEART AND MEDIASTINUM: No acute abnormality of the cardiac and mediastinal silhouettes. BONES AND SOFT TISSUES: Negative for visible bowel gas. No acute osseous abnormality. IMPRESSION: 1. No acute cardiopulmonary abnormality. Electronically signed by: Helayne Hurst MD 02/13/2024 10:44 AM EST RP Workstation: [id number]     Procedures   Medications Ordered in the ED  acetaminophen  (TYLENOL ) tablet 1,000 mg (has no administration in time range)  famotidine  (PEPCID ) tablet 20 mg (has no administration in time range)  clindamycin  (CLEOCIN ) capsule 450 mg (has no administration in time range)                                    Medical Decision Making Amount and/or Complexity of Data Reviewed Labs: ordered. Radiology: ordered.  Risk OTC  drugs. Prescription drug management.   Initial Impression and Ddx 54 year old well-appearing male presenting for chest pain and dental pain.  Exam notable for some generally poor dentition with some erythema and tenderness in the right upper teeth.  DDx includes PE, ACS, dissection, reflux, peritonsillar abscess, Ludwig's angina, apical abscess, dental infection, other. Patient PMH that increases complexity of ED encounter:  h/o bladder cancer  Interpretation of Diagnostics - I independent reviewed and interpreted the labs as followed: WBC 15.1, troponin negative  - I independently visualized the following imaging with scope of interpretation limited to determining acute life threatening conditions related to emergency care: CXR, which revealed no acute findings  -I personally reviewed interpret EKG which revealed NSR  Patient Reassessment and Ultimate Disposition/Management On reassessment still without chest pain. Workup largely reassuring.  Considered PE but unlikely given no chest pain and no findings concerning for DVT and troponin and EKG are normal.  It is possible he may have a developing dental infection but no exam findings to suggest associated deep space abscess or other dangerous pathology.  Started him on clindamycin  advised him to follow-up with a dentist and his PCP.  Fluid challenge with no issue.  Discussed return precautions.  Discharged good condition.  Patient management required discussion with the following services or consulting groups:  None  Complexity of Problems Addressed Acute complicated illness or Injury  Additional Data Reviewed and Analyzed Further history obtained from: Past medical history and medications listed in the EMR and Prior ED visit notes  Patient Encounter Risk Assessment Consideration of hospitalization      Final diagnoses:  Chest pain, unspecified type  Pain, dental    ED Discharge Orders     None          Lang Norleen POUR, PA-C 02/13/24 1120  "

## 2024-02-13 NOTE — ED Notes (Signed)
..  The patient is A&OX4, ambulatory at d/c with independent steady gait, NAD. Pt verbalized understanding of d/c instructions, prescriptions and follow up care.

## 2024-02-14 ENCOUNTER — Emergency Department (HOSPITAL_COMMUNITY)
Admission: EM | Admit: 2024-02-14 | Discharge: 2024-02-14 | Disposition: A | Discharge status: Home / Self Care | Payer: Self-pay | Attending: Emergency Medicine | Admitting: Emergency Medicine

## 2024-02-14 ENCOUNTER — Other Ambulatory Visit: Payer: Self-pay

## 2024-02-14 DIAGNOSIS — K0889 Other specified disorders of teeth and supporting structures: Secondary | ICD-10-CM | POA: Insufficient documentation

## 2024-02-14 DIAGNOSIS — L03211 Cellulitis of face: Secondary | ICD-10-CM | POA: Insufficient documentation

## 2024-02-14 MED ORDER — KETOROLAC TROMETHAMINE 15 MG/ML IJ SOLN
15.0000 mg | Freq: Once | INTRAMUSCULAR | Status: AC
  Administered 2024-02-14: 15 mg via INTRAMUSCULAR
  Filled 2024-02-14: qty 1

## 2024-02-14 NOTE — Discharge Instructions (Addendum)
 Please call your primary care doctor to schedule an appointment for follow-up.  It would also be a good idea to get in contact with a dentist for recheck of dental pain and dental caries.  Please continue 150 mg of clindamycin  3 times a day.  This should cover dental infections as well as facial cellulitis.  Please return to emergency room with new or worsening symptoms including eye pain, pain with eye motion, shortness of breath.

## 2024-02-14 NOTE — ED Triage Notes (Signed)
 PT. Stated, I started having facial swelling 3-4 days, and I know I have a bad gums all in my mouth. I went to [facility] yesterday and they gave me Clindamycin   and Ive had 3 dosess, My swelling has gotten worse.  I just need a 2nd opinion. Wife stated he had a fever yesterday

## 2024-02-14 NOTE — ED Provider Notes (Signed)
 " [place] [facility] DE[facility]MENT AT [facility] Provider Note   CSN: [id number] Arrival date & time: 02/14/24  9177     Patient presents with: No chief complaint on file.   Brent Tran is a 54 y.o. male.  Patient with noncontributory past medical history reports to emergency room with complaint of 2 days of dental pain and facial swelling.  Patient reports that he was seen yesterday for this and started on clindamycin .  He has now taken 3 doses of clindamycin / 1 day.  He has noticed chills at home and feels that the facial swelling seems to be getting worse.  He denies any eye pain or pain with eye range of motion.  He denies vision change.  He denies any anterior neck swelling, tongue swelling or difficulty with phonation or handling secretions.   HPI     Prior to Admission medications  Medication Sig Start Date End Date Taking? Authorizing Provider  clindamycin  (CLEOCIN ) 150 MG capsule Take 3 capsules (450 mg total) by mouth 3 (three) times daily for 7 days. 02/13/24 02/20/24  Robinson, John K, PA-C    Allergies: Ultram [tramadol hcl] and Penicillins    Review of Systems  Skin:  Positive for color change.    Updated Vital Signs BP (!) 159/77 (BP Location: Right Arm)   Pulse 74   Temp 98.1 F (36.7 C) (Oral)   Resp 19   SpO2 98%   Physical Exam Vitals and nursing note reviewed.  Constitutional:      General: He is not in acute distress.    Appearance: He is not toxic-appearing.  HENT:     Head: Normocephalic and atraumatic.     Mouth/Throat:      Comments: Mild right upper anterior tooth pain and gingival swelling.  No obvious dental abscess.  Right sided facial swelling and mild erythema concerning for developing mild facial cellulitis. No area of fluctuance.  No proptosis.  Pupils equal and reactive.  No nystagmus or pain with eye range of motion.  No uvula swelling, handling secretions with normal phonation.  No anterior neck swelling or tongue  swelling Eyes:     General: No scleral icterus.    Conjunctiva/sclera: Conjunctivae normal.  Cardiovascular:     Rate and Rhythm: Normal rate and regular rhythm.     Pulses: Normal pulses.     Heart sounds: Normal heart sounds.  Pulmonary:     Effort: Pulmonary effort is normal. No respiratory distress.     Breath sounds: Normal breath sounds.  Abdominal:     General: Abdomen is flat. Bowel sounds are normal.     Palpations: Abdomen is soft.     Tenderness: There is no abdominal tenderness.  Musculoskeletal:     Right lower leg: No edema.     Left lower leg: No edema.  Skin:    General: Skin is warm and dry.     Findings: No lesion.  Neurological:     General: No focal deficit present.     Mental Status: He is alert and oriented to person, place, and time. Mental status is at baseline.     (all labs ordered are listed, but only abnormal results are displayed) Labs Reviewed - No data to display  EKG: None  Radiology: DG Chest 2 View Result Date: 02/13/2024 EXAM: 2 VIEW(S) XRAY OF THE CHEST 02/13/2024 10:26:00 AM COMPARISON: Chest radiographs 06/10/2022 and earlier. CLINICAL HISTORY: 54 year old male with chest pain and vomiting. FINDINGS: LUNGS AND PLEURA: No  focal pulmonary opacity. No pleural effusion. No pneumothorax. HEART AND MEDIASTINUM: No acute abnormality of the cardiac and mediastinal silhouettes. BONES AND SOFT TISSUES: Negative for visible bowel gas. No acute osseous abnormality. IMPRESSION: 1. No acute cardiopulmonary abnormality. Electronically signed by: Helayne Hurst MD 02/13/2024 10:44 AM EST RP Workstation: [id number]     Procedures   Medications Ordered in the ED - No data to display                                  Medical Decision Making  This patient presents to the ED for concern of facial swelling, this involves an extensive number of treatment options, and is a complaint that carries with it a high risk of complications and morbidity.  The  differential diagnosis includes Ludwig's angina, peritonsillar abscess, retropharyngeal abscess, dental abscess, facial cellulitis   Review:  I have reviewed recent notes/ documentation including  Patient was seen 02/13/2024 at [facility] for chest pain as well as 1 day of dental pain.  Yesterday he was started on clindamycin  for suspected minor developing dental infection.  There were no signs of deep space infection or systemic illness at that time.   Imaging Studies ordered:  Consider, however, patient has been on one day of treatment and we discussed imaging versus continuing current plan for a total of 48 hours to fully eval response to abx. He would like to continue with current treatment plan, deferred imaging at this time, will return and likely benefit from imaging if this are not improving/ worsening.    Cardiac Monitoring: / EKG:  The patient was maintained on a cardiac monitor.     Problem List / ED Course / Critical interventions / Medication management  Patient presents to ED with complaint of facial cellulitis as well as dental pain.  This has been ongoing for 2 days.  On arrival he is hemodynamically stable and he is well-appearing.  He does not have fever and has no sign of systemic illness.  He does have gingival septal swelling and tenderness to right upper teeth.  He has obvious dental caries.  He is now having right sided cheek erythema and mild swelling.   No sign of peritonsillar abscess or Ludwig's angina.  No sign of orbital cellulitis.  He is not having any foreign body sensation in his eye, proptosis, limited eye range of motion or pain with eye range of motion, no double vision. I ordered medication including Toradol  for pain control.   Reevaluation of the patient after these medicines showed that the patient improved I have reviewed the patients home medicines and have made adjustments as needed.  Patient is hemodynamically stable and well-appearing.  No sign of  systemic illness.  No sign of deep space infection.  Recommend continuing current oral antibiotic therapy.  He was given specific and strict return precautions.  He was given follow-up instructions.        Final diagnoses:  Facial cellulitis  Pain, dental    ED Discharge Orders     None          Shermon Warren LOISE DEVONNA 02/14/24 0908    Laurice Maude BROCKS, MD 02/14/24 1058  "

## 2024-02-15 ENCOUNTER — Other Ambulatory Visit: Payer: Self-pay

## 2024-02-15 ENCOUNTER — Emergency Department: Payer: Self-pay

## 2024-02-15 ENCOUNTER — Observation Stay
Admission: EM | Admit: 2024-02-15 | Discharge: 2024-02-16 | Disposition: A | Discharge status: Home / Self Care | Payer: Self-pay | Attending: Student in an Organized Health Care Education/Training Program | Admitting: Student in an Organized Health Care Education/Training Program

## 2024-02-15 DIAGNOSIS — Z8551 Personal history of malignant neoplasm of bladder: Secondary | ICD-10-CM | POA: Insufficient documentation

## 2024-02-15 DIAGNOSIS — R220 Localized swelling, mass and lump, head: Secondary | ICD-10-CM

## 2024-02-15 DIAGNOSIS — F17210 Nicotine dependence, cigarettes, uncomplicated: Secondary | ICD-10-CM | POA: Insufficient documentation

## 2024-02-15 DIAGNOSIS — L03211 Cellulitis of face: Principal | ICD-10-CM | POA: Insufficient documentation

## 2024-02-15 DIAGNOSIS — K047 Periapical abscess without sinus: Principal | ICD-10-CM | POA: Insufficient documentation

## 2024-02-15 LAB — CBC
HCT: 41.5 % (ref 39.0–52.0)
Hemoglobin: 14.2 g/dL (ref 13.0–17.0)
MCH: 30.1 pg (ref 26.0–34.0)
MCHC: 34.2 g/dL (ref 30.0–36.0)
MCV: 87.9 fL (ref 80.0–100.0)
Platelets: 413 10*3/uL — ABNORMAL HIGH (ref 150–400)
RBC: 4.72 MIL/uL (ref 4.22–5.81)
RDW: 12.7 % (ref 11.5–15.5)
WBC: 12.7 10*3/uL — ABNORMAL HIGH (ref 4.0–10.5)
nRBC: 0 % (ref 0.0–0.2)

## 2024-02-15 LAB — BASIC METABOLIC PANEL WITH GFR
Anion gap: 11 (ref 5–15)
BUN: 11 mg/dL (ref 6–20)
CO2: 25 mmol/L (ref 22–32)
Calcium: 9.4 mg/dL (ref 8.9–10.3)
Chloride: 102 mmol/L (ref 98–111)
Creatinine, Ser: 0.77 mg/dL (ref 0.61–1.24)
GFR, Estimated: >60 mL/min
Glucose, Bld: 112 mg/dL — ABNORMAL HIGH (ref 70–99)
Potassium: 4.4 mmol/L (ref 3.5–5.1)
Sodium: 138 mmol/L (ref 135–145)

## 2024-02-15 MED ORDER — BISACODYL 5 MG PO TBEC: 5.0000 mg | DELAYED_RELEASE_TABLET | Freq: Every day | ORAL | Status: DC | PRN

## 2024-02-15 MED ORDER — SODIUM CHLORIDE 0.9 % IV SOLN
1.0000 g | INTRAVENOUS | Status: DC
  Administered 2024-02-16: 1 g via INTRAVENOUS
  Filled 2024-02-15: qty 10

## 2024-02-15 MED ORDER — ACETAMINOPHEN 325 MG PO TABS
650.0000 mg | ORAL_TABLET | Freq: Four times a day (QID) | ORAL | Status: DC | PRN
  Filled 2024-02-15: qty 2

## 2024-02-15 MED ORDER — ALBUTEROL SULFATE (2.5 MG/3ML) 0.083% IN NEBU: 2.5000 mg | INHALATION_SOLUTION | RESPIRATORY_TRACT | Status: DC | PRN

## 2024-02-15 MED ORDER — KETOROLAC TROMETHAMINE 15 MG/ML IJ SOLN
15.0000 mg | Freq: Once | INTRAMUSCULAR | Status: AC
  Administered 2024-02-15: 15 mg via INTRAVENOUS
  Filled 2024-02-15: qty 1

## 2024-02-15 MED ORDER — POLYETHYLENE GLYCOL 3350 17 G PO PACK
17.0000 g | PACK | Freq: Every day | ORAL | Status: DC
  Filled 2024-02-15: qty 1

## 2024-02-15 MED ORDER — ONDANSETRON HCL 4 MG PO TABS: 4.0000 mg | ORAL_TABLET | Freq: Four times a day (QID) | ORAL | Status: DC | PRN

## 2024-02-15 MED ORDER — METRONIDAZOLE 500 MG/100ML IV SOLN
500.0000 mg | Freq: Two times a day (BID) | INTRAVENOUS | Status: AC
  Administered 2024-02-15 – 2024-02-16 (×3): 500 mg via INTRAVENOUS
  Filled 2024-02-15 (×3): qty 100

## 2024-02-15 MED ORDER — ACETAMINOPHEN 650 MG RE SUPP: 650.0000 mg | Freq: Four times a day (QID) | RECTAL | Status: DC | PRN

## 2024-02-15 MED ORDER — NICOTINE 7 MG/24HR TD PT24: 7.0000 mg | MEDICATED_PATCH | Freq: Every day | TRANSDERMAL | Status: DC | PRN

## 2024-02-15 MED ORDER — SODIUM CHLORIDE 0.9 % IV SOLN
2.0000 g | Freq: Once | INTRAVENOUS | Status: AC
  Administered 2024-02-15: 2 g via INTRAVENOUS
  Filled 2024-02-15: qty 20

## 2024-02-15 MED ORDER — DEXAMETHASONE SOD PHOSPHATE PF 10 MG/ML IJ SOLN
10.0000 mg | Freq: Once | INTRAMUSCULAR | Status: AC
  Administered 2024-02-15: 10 mg via INTRAVENOUS
  Filled 2024-02-15: qty 1

## 2024-02-15 MED ORDER — ENOXAPARIN SODIUM 40 MG/0.4ML IJ SOSY
40.0000 mg | PREFILLED_SYRINGE | INTRAMUSCULAR | Status: DC
  Administered 2024-02-15: 40 mg via SUBCUTANEOUS
  Filled 2024-02-15: qty 0.4

## 2024-02-15 MED ORDER — ONDANSETRON HCL 4 MG/2ML IJ SOLN: 4.0000 mg | Freq: Four times a day (QID) | INTRAMUSCULAR | Status: DC | PRN

## 2024-02-15 MED ORDER — IOHEXOL 300 MG/ML  SOLN
75.0000 mL | Freq: Once | INTRAMUSCULAR | Status: AC | PRN
  Administered 2024-02-15: 75 mL via INTRAVENOUS

## 2024-02-15 NOTE — ED Triage Notes (Signed)
 Pt to ED via P[facility] from home. Pt reports right sided facial/sinus swelling from abscess tooth. Pt reports pain and swelling has gotten worse. Pt started antibiotic Monday.

## 2024-02-15 NOTE — H&P (Signed)
 " History and Physical  Brent Tran FMW:[id number] DOB: 02-Oct-1970 DOA: 02/15/2024 PCP: Melvenia Manus BRAVO, MD  Chief Complaint: tooth pain/swelling Historian: patient  HPI:  Brent Tran is a 54 y.o. male with a PMH significant for history of bladder cancer, tobacco use. At baseline, they live with their wife and are independent with their ADLs.  They presented from home to the ED on 02/15/2024 with worsening pain, redness and swelling of R cheek/teeth x4 days. He has known dental carries but due to loss of insurance he has not seen a dentist in over 2 years despite being told they were supposed to pull two teeth. He has also continued to smoke during this time but has cut down to a few cigarettes per day.  He states that 4 days ago he began having pain, redness, swelling in his upper Right gum at the known teeth and so he took some antibiotics they had at home which did not help. Also had chills and subjective fever but measured no temp.  He was seen by OSH ED twice and given clindamycin  which he was adherent with but had worsening of his symptoms. Denies N/V/D. H/o penicillin allergy.  Denies difficulty with breathing or swallowing. No drainage.   In the ED, it was found that they had stable vital signs. Labs remarkable for mild leukocytosis.  Significant findings included: CT maxillofacial:  1. Right facial soft tissue swelling extending from the infraorbital region to the lateral aspect of the chin without abscess or drainable fluid collection, most consistent with cellulitis/phlegmon (odontogenic source favored given adjacent dental disease; consider less likely nonodontogenic facial cellulitis/dermatitis). 2. Multiple right-sided maxillary and mandibular dental caries; correlate with odontogenic pain/tenderness and consider dental radiographs/endodontic evaluation as indicated. 3. Paranasal sinus mucosal disease (right maxillary floor, ethmoid air cells, sphenoid sinus) without  convincing odontogenic maxillary sinusitis.  They were initially treated with CTX.   Patient was admitted to medicine service for further workup and management of odontic infection as outlined in detail below.  Assessment/Plan Principal Problem:   Dental infection   Odontic infection- no discernable drainable collection on CT. Already improved with first dose of IV Abx, per report.  - continue CTX and and flagyl  - possible to transition back to PO tomorrow and dc.  - ultimately needs dental work with tooth extraction - monitor for fever  Tobacco use - nicotine  patch PRN for withdrawals - counseled for cessation  Past Medical History:  Diagnosis Date   Cancer (HCC)    bladder   Past Surgical History:  Procedure Laterality Date   TONSILLECTOMY     TUMOR REMOVAL      reports that he has been smoking cigarettes and cigars. He started smoking about 39 years ago. He has a 36 pack-year smoking history. He has never used smokeless tobacco. He reports that he does not currently use alcohol. 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  Allergies[1]  Family History  Problem Relation Age of Onset   Heart disease Mother    Heart disease Father    Cancer Father    Prior to Admission medications  Medication Sig Start Date End Date Taking? Authorizing Provider  clindamycin  (CLEOCIN ) 150 MG capsule Take 3 capsules (450 mg total) by mouth 3 (three) times daily for 7 days. 02/13/24 02/20/24  Lang Norleen POUR, PA-C   I have personally, briefly reviewed patient's prior medical records in [facility]  Objective: Blood pressure 129/86, pulse 66, temperature 98 F (36.7 C), temperature source Oral, resp. rate 20, height 5' 10 (1.778 m), weight 86 kg, SpO2 97%.   Constitutional: NAD, calm, comfortable HEENT: lids and conjunctivae normal. MMM. Posterior pharynx clear of any exudate or  lesions. poor dentition. Extensive caries. Erythema and peridontitis. No abscess or drainage appreciated Neck: normal, supple, no masses, no thyromegaly Respiratory: CTAB, no wheezing, no crackles. Normal respiratory effort. No accessory muscle use.  Cardiovascular: RRR, no murmurs / rubs / gallops.  Musculoskeletal: No joint deformity upper and lower extremities. Normal muscle tone.  Skin: dry, intact, normal color, normal temperature on exposed skin Neurologic: Alert and oriented x 3. Normal speech. Grossly non-focal exam. PERRL Psychiatric: Normal mood. Congruent affect.  Labs on Admission: I have personally reviewed admission labs and imaging studies  CBC    Component Value Date/Time   WBC 12.7 (H) 02/15/2024 0932   RBC 4.72 02/15/2024 0932   HGB 14.2 02/15/2024 0932   HCT 41.5 02/15/2024 0932   PLT 413 (H) 02/15/2024 0932   MCV 87.9 02/15/2024 0932   MCH 30.1 02/15/2024 0932   MCHC 34.2 02/15/2024 0932   RDW 12.7 02/15/2024 0932   LYMPHSABS 3.8 11/07/2023 2300   MONOABS 0.7 11/07/2023 2300   EOSABS 0.6 (H) 11/07/2023 2300   BASOSABS 0.1 11/07/2023 2300   CMP     Component Value Date/Time   NA 138 02/15/2024 0932   K 4.4 02/15/2024 0932   CL 102 02/15/2024 0932   CO2 25 02/15/2024 0932   GLUCOSE 112 (H) 02/15/2024 0932   BUN 11 02/15/2024 0932   CREATININE 0.77 02/15/2024 0932   CALCIUM 9.4 02/15/2024 0932   PROT 6.9 11/07/2023 2300   ALBUMIN 4.4 11/07/2023 2300   AST 21 11/07/2023 2300   ALT 17 11/07/2023 2300   ALKPHOS 68 11/07/2023 2300   BILITOT 0.7 11/07/2023 2300   GFRNONAA >60 02/15/2024 0932   GFRAA >60 03/22/2019 2109    Radiological Exams on Admission: CT MAXILLOFACIAL W CONTRAST Result Date: 02/15/2024 EXAM: CT Facial Bones with contrast 02/15/2024 11:09:12 AM TECHNIQUE: CT of the facial bones was performed with the administration of intravenous contrast (75 mL iohexol  300 MG/ML solution). Multiplanar reformatted images are provided for review.  Automated exposure control, iterative reconstruction, and/or weight based adjustment of the mA/kV was utilized to reduce the radiation dose to as low as reasonably achievable. COMPARISON: None available CLINICAL HISTORY: Sublingual/submandibular abscess; swelling under eye likely from dental infection. FINDINGS: AERODIGESTIVE TRACT: No mass. No edema. SALIVARY GLANDS: The parotid and submandibular glands are unremarkable. LYMPH NODES: No suspicious cervical lymphadenopathy. SOFT TISSUES: There is soft tissue swelling of the right face extending from the infraorbital region to the lateral aspect of the chin. There is no evidence of abscess or drainable fluid collection. There is no evidence of sublingual or submandibular abscess. BRAIN, ORBITS AND SINUSES: There is mucosal disease within the floor of the right maxillary sinus, but it does not appear to be odontogenic in origin. There is also mild mucosal disease within the ethmoid air cells and sphenoid sinus. BONES: There are multiple carries on the right involving both maxillary and mandibular teeth. No acute abnormality. No suspicious bone lesion. IMPRESSION: 1. Right facial soft tissue swelling extending from the infraorbital region to the lateral aspect of the chin without abscess or drainable fluid collection, most consistent with cellulitis/phlegmon (odontogenic source favored given adjacent dental disease; consider less likely nonodontogenic facial cellulitis/dermatitis). 2. Multiple right-sided maxillary and  mandibular dental caries; correlate with odontogenic pain/tenderness and consider dental radiographs/endodontic evaluation as indicated. 3. Paranasal sinus mucosal disease (right maxillary floor, ethmoid air cells, sphenoid sinus) without convincing odontogenic maxillary sinusitis. Electronically signed by: Evalene Coho MD 02/15/2024 11:30 AM EST RP Workstation: [id number]    DVT prophylaxis: enoxaparin  (LOVENOX ) injection 30 mg Start: 02/15/24  1230   Code Status: full  Family Communication: wife at bedside  Disposition Plan: likely dc tomorrow  Consults called: none    Marien LITTIE Piety, DO [facility]  02/15/2024, 12:29 PM    To contact the appropriate [facility] Attending or Consulting provider: Check [email or web address] for coverage from 7pm-7am     [1]  Allergies Allergen Reactions   Ultram [Tramadol Hcl]     Mood swings   Penicillins Swelling and Rash   "

## 2024-02-15 NOTE — ED Provider Notes (Signed)
 "  [facility] Provider Note    Event Date/Time   First MD Initiated Contact with Patient 02/15/24 [phone]     (approximate)   History   Facial Swelling   HPI  Brent Tran is a 54 y.o. male who presents today for evaluation of dental infection.  Patient reports that his symptoms began 3 to 4 days ago with dental pain, and he began to have facial swelling over the last couple of days.  He went to outpatient emergency department yesterday as well as the day before.  He was started on clindamycin .  He symptoms continued to worsen so he [name] presented to emergency department yesterday.  He was discharged from the emergency department yesterday and instructed to continue his antibiotic.  Patient returns to the emergency department today with worsening swelling and redness to the side of his face, no fevers or chills.  He has no pain with moving his eye, and no visual changes.  Patient Active Problem List   Diagnosis Date Noted   Dental infection 02/15/2024   Allergic rhinitis 05/26/2022   Witnessed apneic spells 05/26/2022   Encounter for general adult medical examination with abnormal findings 05/26/2022   Screening for lung cancer 05/26/2022   Immunization due 05/26/2022   Screening for prostate cancer 05/26/2022   History of bladder cancer 04/28/2022          Physical Exam   Triage Vital Signs: ED Triage Vitals [02/15/24 0930]  Encounter Vitals Group     BP (!) 129/90     Girls Systolic BP Percentile      Girls Diastolic BP Percentile      Boys Systolic BP Percentile      Boys Diastolic BP Percentile      Pulse Rate 69     Resp 18     Temp 98 F (36.7 C)     Temp Source Oral     SpO2 98 %     Weight 189 lb 9.5 oz (86 kg)     Height 5' 10 (1.778 m)     Head Circumference      Peak Flow      Pain Score 2     Pain Loc      Pain Education      Exclude from Growth Chart     Most recent vital signs: Vitals:   02/15/24 1121 02/15/24 1313   BP: 129/86 124/81  Pulse: 66 73  Resp: 20 16  Temp:  97.8 F (36.6 C)  SpO2: 97% 97%    Physical Exam Vitals and nursing note reviewed.  Constitutional:      General: Awake and alert. No acute distress.    Appearance: Normal appearance. The patient is normal weight.  HENT:     Head: Normocephalic and atraumatic.     Mouth: Mucous membranes are moist.  Poor dentition diffusely.  Tooth #6 and 7 with obvious dental decay, no focal gingival fluctuance.  No sublingual swelling, trismus, or drooling.  There is swelling to his right cheek extending to his infraorbital region with erythema.  There is no lid swelling or erythema.  He has full and normal extraocular movements.  No proptosis. Eyes:     General: PERRL. Normal EOMs        Right eye: No discharge.        Left eye: No discharge.     Conjunctiva/sclera: Conjunctivae normal.  Cardiovascular:     Rate and Rhythm: Normal rate and regular  rhythm.     Pulses: Normal pulses.  Pulmonary:     Effort: Pulmonary effort is normal. No respiratory distress.     Breath sounds: Normal breath sounds.  Abdominal:     Abdomen is soft. There is no abdominal tenderness. No rebound or guarding. No distention. Musculoskeletal:        General: No swelling. Normal range of motion.     Cervical back: Normal range of motion and neck supple.  Skin:    General: Skin is warm and dry.     Capillary Refill: Capillary refill takes less than 2 seconds.     Findings: No rash.  Neurological:     Mental Status: The patient is awake and alert.      ED Results / Procedures / Treatments   Labs (all labs ordered are listed, but only abnormal results are displayed) Labs Reviewed  CBC - Abnormal; Notable for the following components:      Result Value   WBC 12.7 (*)    Platelets 413 (*)    All other components within normal limits  BASIC METABOLIC PANEL WITH GFR - Abnormal; Notable for the following components:   Glucose, Bld 112 (*)    All other  components within normal limits     EKG     RADIOLOGY I independently reviewed and interpreted imaging and agree with radiologists findings.     PROCEDURES:  Critical Care performed:   Procedures   MEDICATIONS ORDERED IN ED: Medications  acetaminophen  (TYLENOL ) tablet 650 mg (has no administration in time range)    Or  acetaminophen  (TYLENOL ) suppository 650 mg (has no administration in time range)  polyethylene glycol (MIRALAX  / GLYCOLAX ) packet 17 g (has no administration in time range)  bisacodyl  (DULCOLAX) EC tablet 5 mg (has no administration in time range)  ondansetron  (ZOFRAN ) tablet 4 mg (has no administration in time range)    Or  ondansetron  (ZOFRAN ) injection 4 mg (has no administration in time range)  albuterol  (PROVENTIL ) (2.5 MG/3ML) 0.083% nebulizer solution 2.5 mg (has no administration in time range)  nicotine  (NICODERM CQ  - dosed in mg/24 hr) patch 7 mg (has no administration in time range)  metroNIDAZOLE  (FLAGYL ) IVPB 500 mg (has no administration in time range)  enoxaparin  (LOVENOX ) injection 40 mg (has no administration in time range)  cefTRIAXone  (ROCEPHIN ) 1 g in sodium chloride  0.9 % 100 mL IVPB (has no administration in time range)  dexamethasone  (DECADRON ) injection 10 mg (10 mg Intravenous Given 02/15/24 1119)  cefTRIAXone  (ROCEPHIN ) 2 g in sodium chloride  0.9 % 100 mL IVPB (0 g Intravenous Stopped 02/15/24 1157)  ketorolac  (TORADOL ) 15 MG/ML injection 15 mg (15 mg Intravenous Given 02/15/24 1120)  iohexol  (OMNIPAQUE ) 300 MG/ML solution 75 mL (75 mLs Intravenous Contrast Given 02/15/24 1032)     IMPRESSION / MDM / ASSESSMENT AND PLAN / ED COURSE  I reviewed the triage vital signs and the nursing notes.   Differential diagnosis includes, but is not limited to, dental infection, cellulitis, phlegmon, abscess  Patient is awake and alert, hemodynamically stable and afebrile.  He has obvious swelling and erythema to the right side of his face  extending just inferior to his right eye.  There is no lid edema or erythema, and he has full extraocular movements, do not suspect orbital cellulitis, though early preseptal cellulitis may be developing.  He does have poor dentition diffusely, no sublingual swelling to suggest Ludwig's angina.  Labs are obtained in triage and patient has a leukocytosis  to 12.7.  I am concerned about his worsening infection in the setting of already being on oral antibiotics.  Patient agrees with plan for CT scan.  In the meantime he was treated with Decadron , Rocephin , and Toradol  with good effect.  CT reveals right facial soft tissue swelling extending from the infraorbital region to the lateral aspect of the chin without drainable fluid collection.  Radiographical findings are most consistent with cellulitis/phlegmon likely from an odontogenic source.  Given worsening symptoms on oral antibiotics as well as his penicillin allergy, I recommended admission to the hospital for continued IV antibiotics.  Patient is in agreement with this plan.  I consulted the hospitalist for admission.  Patient was accepted by Dr. Lenon.  Patient's presentation is most consistent with acute presentation with potential threat to life or bodily function.      FINAL CLINICAL IMPRESSION(S) / ED DIAGNOSES   Final diagnoses:  Facial cellulitis  Dental infection  Facial swelling     Rx / DC Orders   ED Discharge Orders     None        Note:  This document was prepared using Dragon voice recognition software and may include unintentional dictation errors.   Brode Sculley E, PA-C 02/15/24 1320    Fernand Rossie HERO, MD 02/15/24 1538  "

## 2024-02-16 ENCOUNTER — Other Ambulatory Visit: Payer: Self-pay

## 2024-02-16 DIAGNOSIS — L03211 Cellulitis of face: Principal | ICD-10-CM

## 2024-02-16 LAB — CBC
HCT: 40.1 % (ref 39.0–52.0)
Hemoglobin: 13.7 g/dL (ref 13.0–17.0)
MCH: 30 pg (ref 26.0–34.0)
MCHC: 34.2 g/dL (ref 30.0–36.0)
MCV: 87.9 fL (ref 80.0–100.0)
Platelets: 412 10*3/uL — ABNORMAL HIGH (ref 150–400)
RBC: 4.56 MIL/uL (ref 4.22–5.81)
RDW: 12.4 % (ref 11.5–15.5)
WBC: 18.4 10*3/uL — ABNORMAL HIGH (ref 4.0–10.5)
nRBC: 0 % (ref 0.0–0.2)

## 2024-02-16 MED ORDER — AMOXICILLIN-POT CLAVULANATE 875-125 MG PO TABS
1.0000 | ORAL_TABLET | Freq: Two times a day (BID) | ORAL | 0 refills | Status: AC
  Filled 2024-02-16 (×2): qty 10, 5d supply, fill #0

## 2024-02-16 MED ORDER — AMOXICILLIN-POT CLAVULANATE 875-125 MG PO TABS
1.0000 | ORAL_TABLET | Freq: Two times a day (BID) | ORAL | Status: DC
  Administered 2024-02-16: 1 via ORAL
  Filled 2024-02-16: qty 1

## 2024-02-16 NOTE — TOC CM/SW Note (Signed)
 Transition of Care [facility]) - Inpatient Brief Assessment   Patient Details  Name: Brent Tran MRN: [id number] Date of Birth: 06/28/1970  Transition of Care [facility]) CM/SW Contact:    Corean ONEIDA Haddock, RN Phone Number: 02/16/2024, 11:21 AM   Clinical Narrative:  Transition of Care Department [facility]) has reviewed patient and no TOC needs have been identified at this time.  If new patient transition needs arise, please place a TOC consult.   Transition of Care Asessment: Insurance and Status: Insurance coverage has been reviewed Patient has primary care physician: Yes     Prior/Current Home Services: No current home services Social Drivers of Health Review: SDOH reviewed no interventions necessary Readmission risk has been reviewed: Yes Transition of care needs: no transition of care needs at this time

## 2024-02-16 NOTE — Plan of Care (Signed)

## 2024-02-16 NOTE — Progress Notes (Incomplete)
 " PROGRESS NOTE Czar Ysaguirre    DOB: 18-Sep-1970, 54 y.o.  FMW:[id number]    Code Status: Full Code   DOA: 02/15/2024   LOS: 0  Brief hospital course  Willow Reczek is a 54 y.o. male with a PMH significant for ***  They presented from *** to the ED on 02/15/2024 with *** x *** days. ***  In the ED, it was found that they had ***.  Significant findings included ***.  They were initially treated with ***.   Patient was admitted to medicine service for further workup and management of *** as outlined in detail below.  02/16/24 -***  Assessment & Plan  Principal Problem:   Dental infection  *** -   *** -   *** -   *** -   *** -   Body mass index is 27.2 kg/m.  VTE ppx: enoxaparin  (LOVENOX ) injection 40 mg Start: 02/15/24 2200   Diet:     Diet   Diet regular Room service appropriate? Yes; Fluid consistency: Thin   Consultants: ***  Subjective 02/16/24    Pt reports ***   Objective  Blood pressure 122/70, pulse 66, temperature (!) 97.5 F (36.4 C), resp. rate 18, height 5' 10 (1.778 m), weight 86 kg, SpO2 98%. No intake or output data in the 24 hours ending 02/16/24 0705 Filed Weights   02/15/24 0930  Weight: 86 kg     Physical Exam: *** General: awake, alert, NAD HEENT: atraumatic, clear conjunctiva, anicteric sclera, MMM, hearing grossly normal Respiratory: normal respiratory effort. Cardiovascular: extremities well perfused, quick capillary refill, normal S1/S2, RRR, no JVD, murmurs Gastrointestinal: soft, NT, ND Nervous: A&O x3. no gross focal neurologic deficits, normal speech Extremities: moves all equally, no edema, normal tone Skin: dry, intact, normal temperature, normal color. No rashes, lesions or ulcers on exposed skin Psychiatry: normal mood, congruent affect  Labs   I have personally reviewed the following labs and imaging studies CBC    Component Value Date/Time   WBC 18.4 (H) 02/16/2024 0532   RBC 4.56 02/16/2024 0532   HGB  13.7 02/16/2024 0532   HCT 40.1 02/16/2024 0532   PLT 412 (H) 02/16/2024 0532   MCV 87.9 02/16/2024 0532   MCH 30.0 02/16/2024 0532   MCHC 34.2 02/16/2024 0532   RDW 12.4 02/16/2024 0532   LYMPHSABS 3.8 11/07/2023 2300   MONOABS 0.7 11/07/2023 2300   EOSABS 0.6 (H) 11/07/2023 2300   BASOSABS 0.1 11/07/2023 2300      Latest Ref Rng & Units 02/15/2024    9:32 AM 02/13/2024   10:36 AM 11/07/2023   11:00 PM  BMP  Glucose 70 - 99 mg/dL 887  880  76   BUN 6 - 20 mg/dL 11  11  11    Creatinine 0.61 - 1.24 mg/dL 9.22  9.03  9.15   Sodium 135 - 145 mmol/L 138  140  137   Potassium 3.5 - 5.1 mmol/L 4.4  4.1  4.5   Chloride 98 - 111 mmol/L 102  102  102   CO2 22 - 32 mmol/L 25  24  25    Calcium 8.9 - 10.3 mg/dL 9.4  9.3  9.6     CT MAXILLOFACIAL W CONTRAST Result Date: 02/15/2024 EXAM: CT Facial Bones with contrast 02/15/2024 11:09:12 AM TECHNIQUE: CT of the facial bones was performed with the administration of intravenous contrast (75 mL iohexol  300 MG/ML solution). Multiplanar reformatted images are provided for review. Automated exposure control, iterative reconstruction, and/or weight  based adjustment of the mA/kV was utilized to reduce the radiation dose to as low as reasonably achievable. COMPARISON: None available CLINICAL HISTORY: Sublingual/submandibular abscess; swelling under eye likely from dental infection. FINDINGS: AERODIGESTIVE TRACT: No mass. No edema. SALIVARY GLANDS: The parotid and submandibular glands are unremarkable. LYMPH NODES: No suspicious cervical lymphadenopathy. SOFT TISSUES: There is soft tissue swelling of the right face extending from the infraorbital region to the lateral aspect of the chin. There is no evidence of abscess or drainable fluid collection. There is no evidence of sublingual or submandibular abscess. BRAIN, ORBITS AND SINUSES: There is mucosal disease within the floor of the right maxillary sinus, but it does not appear to be odontogenic in origin. There  is also mild mucosal disease within the ethmoid air cells and sphenoid sinus. BONES: There are multiple carries on the right involving both maxillary and mandibular teeth. No acute abnormality. No suspicious bone lesion. IMPRESSION: 1. Right facial soft tissue swelling extending from the infraorbital region to the lateral aspect of the chin without abscess or drainable fluid collection, most consistent with cellulitis/phlegmon (odontogenic source favored given adjacent dental disease; consider less likely nonodontogenic facial cellulitis/dermatitis). 2. Multiple right-sided maxillary and mandibular dental caries; correlate with odontogenic pain/tenderness and consider dental radiographs/endodontic evaluation as indicated. 3. Paranasal sinus mucosal disease (right maxillary floor, ethmoid air cells, sphenoid sinus) without convincing odontogenic maxillary sinusitis. Electronically signed by: Evalene Coho MD 02/15/2024 11:30 AM EST RP Workstation: [id number]    Disposition Plan & Communication  Patient status: Observation  Admitted From: {From:23814} Planned disposition location: {PLAN; DISPOSITION:26386} Anticipated discharge date: *** pending ***  Family Communication: ***    Author: Marien LITTIE Piety, DO [facility] 02/16/2024, 7:05 AM   Available by Epic secure chat 7AM-7PM. If 7PM-7AM, please contact night-coverage.  [facility] contact information found on [email or web address].  "

## 2024-02-16 NOTE — Discharge Instructions (Signed)
 Please continue to take your antibiotic until it is completely gone, starting with the first dose before bed tonight. Do NOT take the other antibiotic you were previously given.  Follow up with your dentist as early as possible.  I recommend that you avoid smoking if possible as this will worsen your ability to heal from your infection.   Continue on a soft diet to avoid injury to your teeth and gums that are infected.

## 2024-02-16 NOTE — Discharge Summary (Signed)
 " Physician Discharge Summary  Patient: Brent Tran FMW:[id number] DOB: [birth year]   Code Status: Full Code Admit date: 02/15/2024 Discharge date: 02/16/2024 Disposition: Home, No home health services recommended PCP: Melvenia Manus BRAVO, MD  Recommendations for Outpatient Follow-up:  Follow up with PCP within 1-2 weeks Regarding general hospital follow up and preventative care Follow up with dentist as soon as possible  Discharge Diagnoses:  Principal Problem:   Dental infection Active Problems:   Facial cellulitis  Brief Hospital Course Summary: Brent Tran is a 54 y.o. male with a PMH significant for history of bladder cancer, tobacco use. At baseline, they live with their wife and are independent with their ADLs.   They presented from home to the ED on 02/15/2024 with worsening pain, redness and swelling of R  upper cheek/teeth x4 days. He has known dental carries but due to loss of insurance he has not seen a dentist in over 2 years despite being told they were supposed to pull two teeth. He has also continued to smoke during this time but has cut down to a few cigarettes per day.  He states that 4 days ago he began having pain, redness, swelling in his upper Right gum at the known teeth and so he took some antibiotics they had at home which did not help. Also had chills and subjective fever but measured no temp.  He was seen by OSH ED twice and given clindamycin  which he was adherent with but had worsening of his symptoms. Denies N/V/D. H/o penicillin allergy.  Denies difficulty with breathing or swallowing. No drainage.    In the ED, it was found that they had stable vital signs. Labs remarkable for mild leukocytosis.  Significant findings included: CT maxillofacial:  1. Right facial soft tissue swelling extending from the infraorbital region to the lateral aspect of the chin without abscess or drainable fluid collection, most consistent with cellulitis/phlegmon (odontogenic  source favored given adjacent dental disease; consider less likely nonodontogenic facial cellulitis/dermatitis). 2. Multiple right-sided maxillary and mandibular dental caries; correlate with odontogenic pain/tenderness and consider dental radiographs/endodontic evaluation as indicated. 3. Paranasal sinus mucosal disease (right maxillary floor, ethmoid air cells, sphenoid sinus) without convincing odontogenic maxillary sinusitis.   They were initially treated with CTX and IV flagyl  and he tolerated well with improvement in his symptoms. After further interview, his penicillin allergy was removed and he was trialed on augmentin  and he tolerated this well too. He was discharged on this regimen to complete his treatment and instructed to closely follow up with a dentist for definitive treatment.   All other chronic conditions were treated with home medications.    Discharge Condition: Stable, improved Recommended discharge diet: soft mechanical diet  Consultations: None   Procedures/Studies: None   Allergies as of 02/16/2024       Reactions   Ultram [tramadol Hcl]    Mood swings        Medication List     STOP taking these medications    clindamycin  150 MG capsule Commonly known as: CLEOCIN        TAKE these medications    amoxicillin -clavulanate 875-125 MG tablet Commonly known as: AUGMENTIN  Take 1 tablet by mouth every 12 (twelve) hours for 5 days.   ibuprofen  200 MG tablet Commonly known as: ADVIL  Take 200 mg by mouth every 6 (six) hours as needed for mild pain (pain score 1-3).        Follow-up Information     Melvenia Manus BRAVO, MD. Schedule an  appointment as soon as possible for a visit.   Specialty: Internal Medicine Why: If symptoms worsen Contact information: [address] [place] [address] [phone]                Subjective   Pt reports feeling improved. He still has some tenderness but redness and swelling improved.  Tolerating soft diet. No respiratory symptoms. No reaction to augmentin .  Has appointment this afternoon with dentist.   All questions and concerns were addressed at time of discharge.  Objective  Blood pressure 121/76, pulse 82, temperature 97.6 F (36.4 C), resp. rate 18, height 5' 10 (1.778 m), weight 86 kg, SpO2 96%.   General: Pt is alert, awake, not in acute distress Oral: MMM. Posterior pharynx clear of any exudate or lesions. poor dentition. Extensive caries. Erythema and peridontitis. No abscess or drainage appreciated  Cardiovascular: RRR, S1/S2 +, no rubs, no gallops Respiratory: CTA bilaterally, no wheezing, no rhonchi Abdominal: Soft, NT, ND, bowel sounds + Extremities: no edema, no cyanosis  The results of significant diagnostics from this hospitalization (including imaging, microbiology, ancillary and laboratory) are listed below for reference.   Imaging studies: CT MAXILLOFACIAL W CONTRAST Result Date: 02/15/2024 EXAM: CT Facial Bones with contrast 02/15/2024 11:09:12 AM TECHNIQUE: CT of the facial bones was performed with the administration of intravenous contrast (75 mL iohexol  300 MG/ML solution). Multiplanar reformatted images are provided for review. Automated exposure control, iterative reconstruction, and/or weight based adjustment of the mA/kV was utilized to reduce the radiation dose to as low as reasonably achievable. COMPARISON: None available CLINICAL HISTORY: Sublingual/submandibular abscess; swelling under eye likely from dental infection. FINDINGS: AERODIGESTIVE TRACT: No mass. No edema. SALIVARY GLANDS: The parotid and submandibular glands are unremarkable. LYMPH NODES: No suspicious cervical lymphadenopathy. SOFT TISSUES: There is soft tissue swelling of the right face extending from the infraorbital region to the lateral aspect of the chin. There is no evidence of abscess or drainable fluid collection. There is no evidence of sublingual or submandibular abscess.  BRAIN, ORBITS AND SINUSES: There is mucosal disease within the floor of the right maxillary sinus, but it does not appear to be odontogenic in origin. There is also mild mucosal disease within the ethmoid air cells and sphenoid sinus. BONES: There are multiple carries on the right involving both maxillary and mandibular teeth. No acute abnormality. No suspicious bone lesion. IMPRESSION: 1. Right facial soft tissue swelling extending from the infraorbital region to the lateral aspect of the chin without abscess or drainable fluid collection, most consistent with cellulitis/phlegmon (odontogenic source favored given adjacent dental disease; consider less likely nonodontogenic facial cellulitis/dermatitis). 2. Multiple right-sided maxillary and mandibular dental caries; correlate with odontogenic pain/tenderness and consider dental radiographs/endodontic evaluation as indicated. 3. Paranasal sinus mucosal disease (right maxillary floor, ethmoid air cells, sphenoid sinus) without convincing odontogenic maxillary sinusitis. Electronically signed by: Evalene Coho MD 02/15/2024 11:30 AM EST RP Workstation: [id number]   DG Chest 2 View Result Date: 02/13/2024 EXAM: 2 VIEW(S) XRAY OF THE CHEST 02/13/2024 10:26:00 AM COMPARISON: Chest radiographs 06/10/2022 and earlier. CLINICAL HISTORY: 54 year old male with chest pain and vomiting. FINDINGS: LUNGS AND PLEURA: No focal pulmonary opacity. No pleural effusion. No pneumothorax. HEART AND MEDIASTINUM: No acute abnormality of the cardiac and mediastinal silhouettes. BONES AND SOFT TISSUES: Negative for visible bowel gas. No acute osseous abnormality. IMPRESSION: 1. No acute cardiopulmonary abnormality. Electronically signed by: Helayne Hurst MD 02/13/2024 10:44 AM EST RP Workstation: [id number]   Labs: Basic Metabolic Panel: Recent  Labs  Lab 02/13/24 1036 02/15/24 0932  NA 140 138  K 4.1 4.4  CL 102 102  CO2 24 25  GLUCOSE 119* 112*  BUN 11 11  CREATININE 0.96  0.77  CALCIUM 9.3 9.4   CBC: Recent Labs  Lab 02/13/24 1036 02/15/24 0932 02/16/24 0532  WBC 15.1* 12.7* 18.4*  HGB 14.5 14.2 13.7  HCT 41.7 41.5 40.1  MCV 87.4 87.9 87.9  PLT 414* 413* 412*   Microbiology: No results found for this or any previous visit.  Time coordinating discharge: Over 30 minutes  Marien LITTIE Piety, MD  [facility] 02/16/2024, 12:39 PM  "

## 2024-02-20 ENCOUNTER — Ambulatory Visit (HOSPITAL_BASED_OUTPATIENT_CLINIC_OR_DEPARTMENT_OTHER): Payer: Self-pay

## 2024-03-19 ENCOUNTER — Ambulatory Visit (HOSPITAL_BASED_OUTPATIENT_CLINIC_OR_DEPARTMENT_OTHER): Payer: Self-pay

## 2024-06-28 ENCOUNTER — Ambulatory Visit: Payer: Self-pay
